# Patient Record
Sex: Female | Born: 1965 | Race: Black or African American | Hispanic: No | Marital: Married | Smoking: Never smoker
Health system: Southern US, Community
[De-identification: ages and names within clinical notes are randomized; demographics above are authoritative.]

## PROBLEM LIST (undated history)

## (undated) DIAGNOSIS — G35 Multiple sclerosis: Secondary | ICD-10-CM

## (undated) DIAGNOSIS — G35A Relapsing-remitting multiple sclerosis: Secondary | ICD-10-CM

## (undated) HISTORY — PX: TUBAL LIGATION: SHX77

## (undated) HISTORY — DX: Multiple sclerosis: G35

## (undated) HISTORY — PX: UTERINE FIBROID SURGERY: SHX826

## (undated) HISTORY — PX: CHOLECYSTECTOMY: SHX55

---

## 2016-09-30 ENCOUNTER — Other Ambulatory Visit: Payer: Self-pay | Admitting: Neurology

## 2016-09-30 ENCOUNTER — Other Ambulatory Visit: Payer: Self-pay | Admitting: Oncology

## 2016-09-30 ENCOUNTER — Other Ambulatory Visit (HOSPITAL_COMMUNITY): Payer: Self-pay | Admitting: Interventional Radiology

## 2016-09-30 DIAGNOSIS — Q232 Congenital mitral stenosis: Secondary | ICD-10-CM

## 2016-09-30 DIAGNOSIS — K220 Achalasia of cardia: Secondary | ICD-10-CM

## 2016-09-30 DIAGNOSIS — G35 Multiple sclerosis: Secondary | ICD-10-CM

## 2016-10-07 ENCOUNTER — Ambulatory Visit
Admission: RE | Admit: 2016-10-07 | Discharge: 2016-10-07 | Disposition: A | Discharge status: Home / Self Care | Payer: Managed Care, Other (non HMO) | Source: Ambulatory Visit | Attending: Neurology | Admitting: Neurology

## 2016-10-07 ENCOUNTER — Ambulatory Visit
Admission: RE | Admit: 2016-10-07 | Discharge: 2016-10-07 | Disposition: A | Discharge status: Home / Self Care | Payer: Self-pay | Source: Ambulatory Visit | Attending: Neurology | Admitting: Neurology

## 2016-10-07 ENCOUNTER — Ambulatory Visit: Payer: Self-pay

## 2016-10-07 ENCOUNTER — Other Ambulatory Visit: Payer: Self-pay | Admitting: Neurology

## 2016-10-07 DIAGNOSIS — G35 Multiple sclerosis: Secondary | ICD-10-CM

## 2016-10-07 LAB — CBC
HCT: 41.9 % (ref 35.0–47.0)
Hemoglobin: 13.3 g/dL (ref 12.0–16.0)
MCH: 26.5 pg (ref 26.0–34.0)
MCHC: 31.8 g/dL — AB (ref 32.0–36.0)
MCV: 83.3 fL (ref 80.0–100.0)
PLATELETS: 244 10*3/uL (ref 150–440)
RBC: 5.03 MIL/uL (ref 3.80–5.20)
RDW: 12.4 % (ref 11.5–14.5)
WBC: 4.3 10*3/uL (ref 3.6–11.0)

## 2016-10-07 LAB — APTT: aPTT: 31 seconds (ref 24–36)

## 2016-10-07 LAB — PROTIME-INR
INR: 0.98
Prothrombin Time: 13 seconds (ref 11.4–15.2)

## 2016-10-07 LAB — ALBUMIN: ALBUMIN: 4.5 g/dL (ref 3.5–5.0)

## 2016-10-07 NOTE — Progress Notes (Signed)
Patient is unable to have LP today-flying out of town-unable to recover 2 hrs. And follow D/C instructions at home.

## 2016-10-08 LAB — IGG 4: IgG, Subclass 4: 14 mg/dL (ref 2–96)

## 2016-10-09 ENCOUNTER — Ambulatory Visit: Payer: Self-pay

## 2016-10-13 ENCOUNTER — Ambulatory Visit: Payer: Self-pay

## 2016-10-13 ENCOUNTER — Ambulatory Visit
Admission: RE | Admit: 2016-10-13 | Discharge: 2016-10-13 | Disposition: A | Discharge status: Home / Self Care | Payer: Managed Care, Other (non HMO) | Source: Ambulatory Visit | Attending: Neurology | Admitting: Neurology

## 2016-10-13 DIAGNOSIS — G35 Multiple sclerosis: Secondary | ICD-10-CM | POA: Insufficient documentation

## 2016-10-13 LAB — CBC
HCT: 37.1 % (ref 35.0–47.0)
Hemoglobin: 12.3 g/dL (ref 12.0–16.0)
MCH: 27.1 pg (ref 26.0–34.0)
MCHC: 33.1 g/dL (ref 32.0–36.0)
MCV: 82 fL (ref 80.0–100.0)
PLATELETS: 193 10*3/uL (ref 150–440)
RBC: 4.53 MIL/uL (ref 3.80–5.20)
RDW: 12.7 % (ref 11.5–14.5)
WBC: 4.8 10*3/uL (ref 3.6–11.0)

## 2016-10-13 LAB — CSF CELL COUNT WITH DIFFERENTIAL
Eosinophils, CSF: 0 %
Lymphs, CSF: 96 %
Monocyte-Macrophage-Spinal Fluid: 4 %
RBC COUNT CSF: 43 /mm3 — AB (ref 0–3)
Segmented Neutrophils-CSF: 0 %
Tube #: 3
WBC CSF: 17 /mm3 — AB (ref 0–5)

## 2016-10-13 LAB — PROTEIN, CSF: TOTAL PROTEIN, CSF: 35 mg/dL (ref 15–45)

## 2016-10-13 LAB — PROTIME-INR
INR: 0.92
PROTHROMBIN TIME: 12.3 s (ref 11.4–15.2)

## 2016-10-13 LAB — GLUCOSE, CSF: Glucose, CSF: 57 mg/dL (ref 40–70)

## 2016-10-13 LAB — ALBUMIN: Albumin: 4.1 g/dL (ref 3.5–5.0)

## 2016-10-13 MED ORDER — ACETAMINOPHEN 500 MG PO TABS
1000.0000 mg | ORAL_TABLET | Freq: Four times a day (QID) | ORAL | Status: DC | PRN
  Filled 2016-10-13: qty 2

## 2016-10-14 LAB — CSF IGG: IGG CSF: 9 mg/dL — AB (ref 0.0–8.6)

## 2016-10-15 ENCOUNTER — Ambulatory Visit: Payer: Managed Care, Other (non HMO)

## 2016-10-16 LAB — CSF CULTURE
CULTURE: NO GROWTH
GRAM STAIN: NONE SEEN

## 2016-10-16 LAB — CSF CULTURE W GRAM STAIN

## 2016-10-17 LAB — HSV(HERPES SMPLX VRS)ABS-I+II(IGG)-CSF

## 2016-10-20 ENCOUNTER — Ambulatory Visit
Admission: RE | Admit: 2016-10-20 | Discharge: 2016-10-20 | Disposition: A | Discharge status: Home / Self Care | Payer: 59 | Source: Ambulatory Visit | Attending: Neurology | Admitting: Neurology

## 2016-10-20 DIAGNOSIS — M50222 Other cervical disc displacement at C5-C6 level: Secondary | ICD-10-CM | POA: Insufficient documentation

## 2016-10-20 DIAGNOSIS — G35 Multiple sclerosis: Secondary | ICD-10-CM | POA: Diagnosis present

## 2016-10-20 MED ORDER — GADOBENATE DIMEGLUMINE 529 MG/ML IV SOLN
12.0000 mL | Freq: Once | INTRAVENOUS | Status: AC | PRN
  Administered 2016-10-20: 12 mL via INTRAVENOUS

## 2016-10-25 LAB — OLIGOCLONAL BANDS, CSF + SERM

## 2018-03-08 ENCOUNTER — Other Ambulatory Visit (HOSPITAL_COMMUNITY): Payer: Self-pay | Admitting: Physician Assistant

## 2018-03-08 DIAGNOSIS — G35 Multiple sclerosis: Secondary | ICD-10-CM

## 2018-03-21 ENCOUNTER — Ambulatory Visit: Payer: 59

## 2018-04-04 ENCOUNTER — Encounter: Payer: Self-pay | Admitting: Emergency Medicine

## 2018-04-04 ENCOUNTER — Other Ambulatory Visit: Payer: Self-pay

## 2018-04-04 ENCOUNTER — Emergency Department: Payer: 59

## 2018-04-04 ENCOUNTER — Emergency Department
Admission: EM | Admit: 2018-04-04 | Discharge: 2018-04-04 | Disposition: A | Discharge status: Home / Self Care | Payer: 59 | Attending: Emergency Medicine | Admitting: Emergency Medicine

## 2018-04-04 ENCOUNTER — Ambulatory Visit
Admission: RE | Admit: 2018-04-04 | Discharge: 2018-04-04 | Disposition: A | Discharge status: Home / Self Care | Payer: 59 | Source: Ambulatory Visit | Attending: Physician Assistant | Admitting: Physician Assistant

## 2018-04-04 DIAGNOSIS — G35 Multiple sclerosis: Secondary | ICD-10-CM | POA: Insufficient documentation

## 2018-04-04 DIAGNOSIS — Z79899 Other long term (current) drug therapy: Secondary | ICD-10-CM | POA: Diagnosis not present

## 2018-04-04 DIAGNOSIS — M79604 Pain in right leg: Secondary | ICD-10-CM | POA: Diagnosis not present

## 2018-04-04 MED ORDER — GADOBENATE DIMEGLUMINE 529 MG/ML IV SOLN
15.0000 mL | Freq: Once | INTRAVENOUS | Status: AC | PRN
  Administered 2018-04-04: 12 mL via INTRAVENOUS

## 2018-04-04 NOTE — Discharge Instructions (Addendum)
Follow up with your primary care provider if any continued problems.  You may take Tylenol as needed for leg pain.

## 2018-04-04 NOTE — ED Triage Notes (Signed)
Pt states right leg tenderness noted yesterday, concerned for blood clot, area noted to be bruised, no swelling or warmth noted.

## 2018-04-04 NOTE — ED Provider Notes (Signed)
[facility] Emergency Department Provider Note   ____________________________________________   First MD Initiated Contact with Patient 04/04/18 [phone]     (approximate)  I have reviewed the triage vital signs and the nursing notes.   HISTORY  Chief Complaint Leg Pain   HPI Tabitha Andrews is a 52 y.o. female is here with complaint of right lower leg bruising with some mild pain.  Patient states that she is unaware of any injury to her leg but has a bruise on the posterior aspect of her lower leg.  Patient states that she does sleep with her legs crossed at night.  Patient is concerned of a DVT.  She states that she flew last month for 2 hours.  She denies any difficulty breathing.  She continues to ambulate without any difficulty.  She is not aware of any warmth or redness in the area.  Patient does have a history of multiple sclerosis.  She rates her pain as a 1/10.  History reviewed. No pertinent past medical history.  There are no active problems to display for this patient.   Past Surgical History:  Procedure Laterality Date  . CHOLECYSTECTOMY    . UTERINE FIBROID SURGERY N/A     Prior to Admission medications   Medication Sig Start Date End Date Taking? Authorizing Provider  Cholecalciferol 10000 units TABS Take by mouth.   Yes [provider]  hydroquinone 4 % cream Apply topically 2 (two) times daily.   Yes [provider]    Allergies Patient has no known allergies.  No family history on file.  Social History Social History   Tobacco Use  . Smoking status: Never Smoker  . Smokeless tobacco: Never Used  Substance Use Topics  . Alcohol use: No  . Drug use: No    Review of Systems Constitutional: No fever/chills Cardiovascular: Denies chest pain. Respiratory: Denies shortness of breath. Musculoskeletal: Positive for right leg pain and localized bruise.. Skin: Negative for rash. Neurological: Negative for focal  weakness or numbness. ___________________________________________   PHYSICAL EXAM:  VITAL SIGNS: ED Triage Vitals  Enc Vitals Group     BP 04/04/18 0849 110/71     Pulse Rate 04/04/18 0849 70     Resp 04/04/18 0849 18     Temp 04/04/18 0849 98.5 F (36.9 C)     Temp src --      SpO2 04/04/18 0849 99 %     Weight 04/04/18 0850 134 lb (60.8 kg)     Height 04/04/18 0850 5\' 4"  (1.626 m)     Head Circumference --      Peak Flow --      Pain Score 04/04/18 0850 1     Pain Loc --      Pain Edu? --      Excl. in GC? --    Constitutional: Alert and oriented. Well appearing and in no acute distress. Eyes: Conjunctivae are normal.  Head: Atraumatic. Neck: No stridor.   Cardiovascular: Normal rate, regular rhythm. Grossly normal heart sounds.  Good peripheral circulation. Respiratory: Normal respiratory effort.  No retractions. Lungs CTAB. Gastrointestinal: Soft and nontender. No distention.  Musculoskeletal: Examination of the right leg there is no gross deformity and no edema appreciated in comparison to the left.  Range of motion is without restriction.  There is a localized single ecchymotic area posterior calf approximately 4 to 5 cm below the knee joint.  Skin is still intact.  No warmth or redness is appreciated.  Homans sign is negative. Neurologic:  Normal speech and language. No gross focal neurologic deficits are appreciated. No gait instability. Skin:  Skin is warm, dry and intact.  Ecchymotic area as noted above. Psychiatric: Mood and affect are normal. Speech and behavior are normal.  ____________________________________________   LABS (all labs ordered are listed, but only abnormal results are displayed)  Labs Reviewed - No data to display   RADIOLOGY  Official radiology report(s): US Venous Img Lower Unilateral Right  Result Date: 04/04/2018 CLINICAL DATA:  Pain and bruising, recent air travel EXAM: RIGHT LOWER EXTREMITY VENOUS DOPPLER ULTRASOUND TECHNIQUE:  Gray-scale sonography with compression, as well as color and duplex ultrasound, were performed to evaluate the deep venous system from the level of the common femoral vein through the popliteal and proximal calf veins. COMPARISON:  None FINDINGS: Normal compressibility of the common femoral, superficial femoral, and popliteal veins, as well as the proximal calf veins. No filling defects to suggest DVT on grayscale or color Doppler imaging. Doppler waveforms show normal direction of venous flow, normal respiratory phasicity and response to augmentation. Survey views of the contralateral common femoral vein are unremarkable. IMPRESSION: No evidence of right lower extremity deep vein thrombosis. Electronically Signed   By: Corlis Leak M.D.   On: 04/04/2018 10:10    ____________________________________________   PROCEDURES  Procedure(s) performed: None  Procedures  Critical Care performed: No  ____________________________________________   INITIAL IMPRESSION / ASSESSMENT AND PLAN / ED COURSE  As part of my medical decision making, I reviewed the following data within the electronic MEDICAL RECORD NUMBER Notes from prior ED visits and [place] Controlled Substance Database  Patient is here with complaint of right lower leg pain and tenderness x1 day.  Patient is concerned for a DVT without previous history.  Patient flew last month but has no other risk factors.  Ultrasound was negative for the DVT and patient was reassured.  She is to follow-up with her PCP if any continued problems.  She will take Tylenol if needed for pain.  ____________________________________________   FINAL CLINICAL IMPRESSION(S) / ED DIAGNOSES  Final diagnoses:  Right leg pain     ED Discharge Orders    None       Note:  This document was prepared using Dragon voice recognition software and may include unintentional dictation errors.    Tommi Rumps, PA-C 04/04/18 1104    Arnaldo Natal, MD 04/04/18  1537

## 2018-04-04 NOTE — ED Notes (Signed)
See triage note  Presents with right leg tenderness  States sx's started yesterday  Denies any injury  Small bruising noted

## 2019-07-31 ENCOUNTER — Other Ambulatory Visit
Admission: RE | Admit: 2019-07-31 | Discharge: 2019-07-31 | Disposition: A | Discharge status: Home / Self Care | Payer: Self-pay | Source: Ambulatory Visit | Attending: Neurology | Admitting: Neurology

## 2019-07-31 DIAGNOSIS — G35 Multiple sclerosis: Secondary | ICD-10-CM | POA: Insufficient documentation

## 2019-07-31 DIAGNOSIS — Z79899 Other long term (current) drug therapy: Secondary | ICD-10-CM | POA: Insufficient documentation

## 2019-07-31 LAB — COMPREHENSIVE METABOLIC PANEL
ALT: 19 U/L (ref 0–44)
AST: 19 U/L (ref 15–41)
Albumin: 4.5 g/dL (ref 3.5–5.0)
Alkaline Phosphatase: 83 U/L (ref 38–126)
Anion gap: 9 (ref 5–15)
BUN: 18 mg/dL (ref 6–20)
CO2: 30 mmol/L (ref 22–32)
Calcium: 9.4 mg/dL (ref 8.9–10.3)
Chloride: 99 mmol/L (ref 98–111)
Creatinine, Ser: 0.78 mg/dL (ref 0.44–1.00)
GFR calc Af Amer: >60 mL/min (ref >60)
GFR calc non Af Amer: >60 mL/min (ref >60)
Glucose, Bld: 90 mg/dL (ref 70–99)
Potassium: 3.4 mmol/L — ABNORMAL LOW (ref 3.5–5.1)
Sodium: 138 mmol/L (ref 135–145)
Total Bilirubin: 0.6 mg/dL (ref 0.3–1.2)
Total Protein: 8.2 g/dL — ABNORMAL HIGH (ref 6.5–8.1)

## 2019-07-31 LAB — CBC WITH DIFFERENTIAL/PLATELET
Abs Immature Granulocytes: 0.01 10*3/uL (ref 0.00–0.07)
Basophils Absolute: 0 10*3/uL (ref 0.0–0.1)
Basophils Relative: 1 %
Eosinophils Absolute: 0 10*3/uL (ref 0.0–0.5)
Eosinophils Relative: 0 %
HCT: 42.3 % (ref 36.0–46.0)
Hemoglobin: 13.3 g/dL (ref 12.0–15.0)
Immature Granulocytes: 0 %
Lymphocytes Relative: 32 %
Lymphs Abs: 1 10*3/uL (ref 0.7–4.0)
MCH: 26.9 pg (ref 26.0–34.0)
MCHC: 31.4 g/dL (ref 30.0–36.0)
MCV: 85.5 fL (ref 80.0–100.0)
Monocytes Absolute: 0.4 10*3/uL (ref 0.1–1.0)
Monocytes Relative: 14 %
Neutro Abs: 1.7 10*3/uL (ref 1.7–7.7)
Neutrophils Relative %: 53 %
Platelets: 218 10*3/uL (ref 150–400)
RBC: 4.95 MIL/uL (ref 3.87–5.11)
RDW: 12.4 % (ref 11.5–15.5)
WBC: 3.2 10*3/uL — ABNORMAL LOW (ref 4.0–10.5)
nRBC: 0 % (ref 0.0–0.2)

## 2019-07-31 LAB — HEPATITIS C ANTIBODY: HCV Ab: NONREACTIVE

## 2019-07-31 LAB — HEPATITIS B CORE ANTIBODY, TOTAL: Hep B Core Total Ab: NONREACTIVE

## 2019-07-31 LAB — PREGNANCY, URINE: Preg Test, Ur: NEGATIVE

## 2019-07-31 LAB — HEPATITIS B SURFACE ANTIGEN: Hepatitis B Surface Ag: NONREACTIVE

## 2019-07-31 LAB — HIV ANTIBODY (ROUTINE TESTING W REFLEX): HIV Screen 4th Generation wRfx: NONREACTIVE

## 2019-08-01 LAB — HEPATITIS B SURFACE ANTIBODY, QUANTITATIVE: Hepatitis B-Post: <3.1 m[IU]/mL — ABNORMAL LOW (ref >9.9)

## 2019-08-02 LAB — QUANTIFERON-TB GOLD PLUS (RQFGPL)
QuantiFERON Mitogen Value: 6.41 IU/mL
QuantiFERON Nil Value: 0.03 IU/mL
QuantiFERON TB1 Ag Value: 0.04 IU/mL
QuantiFERON TB2 Ag Value: 0.03 IU/mL

## 2019-08-02 LAB — QUANTIFERON-TB GOLD PLUS: QuantiFERON-TB Gold Plus: NEGATIVE

## 2020-03-02 IMAGING — US US EXTREM LOW VENOUS*R*
1 series · 14 of 24 positions shown · non-contrast
Comparison: None

CLINICAL DATA: Pain and bruising, recent air travel

EXAM:
RIGHT LOWER EXTREMITY VENOUS DOPPLER ULTRASOUND
TECHNIQUE: Gray-scale sonography with compression, as well as color and duplex
ultrasound, were performed to evaluate the deep venous system from
the level of the common femoral vein through the popliteal and
proximal calf veins.

[Series 1: us extrem low venous*right* · 0.06mm/px · 14 of 41 slices shown]
[im 1/41]
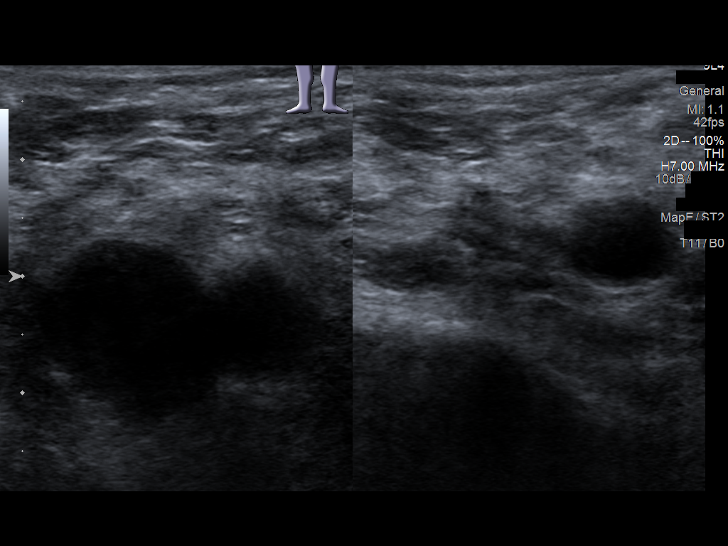
[im 4/41]
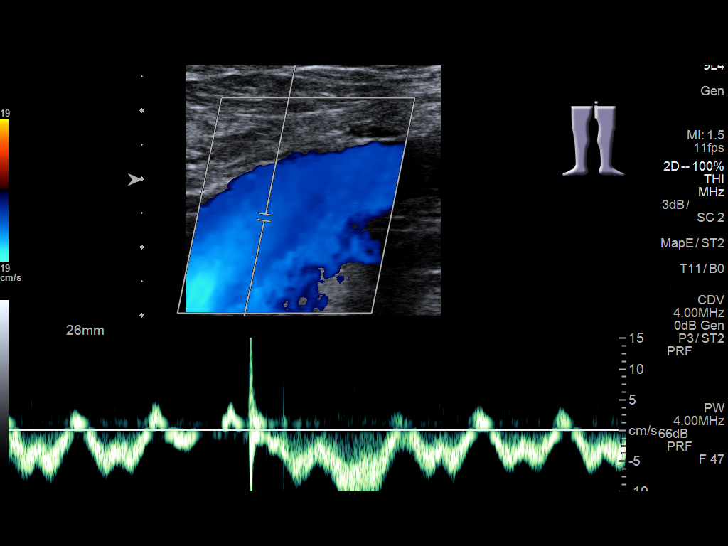
[im 7/41]
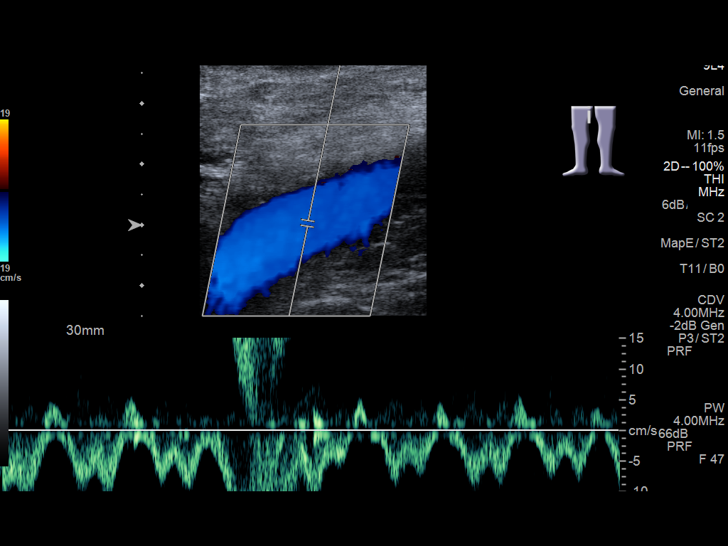
[im 11/41]
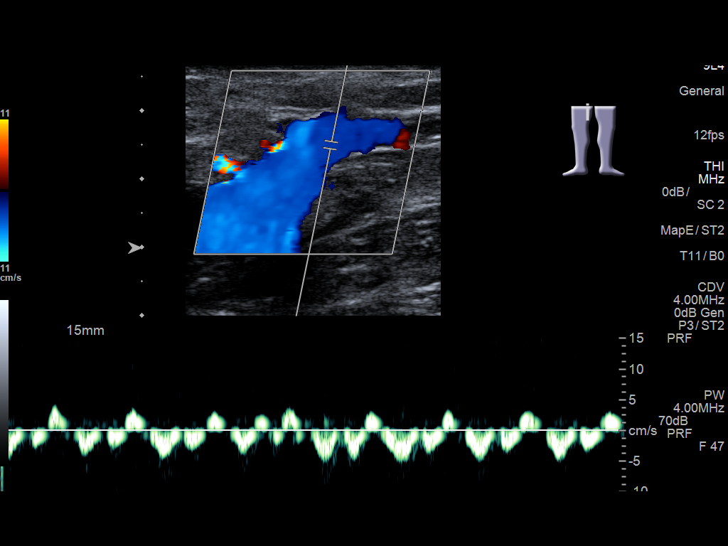
[im 13/41]
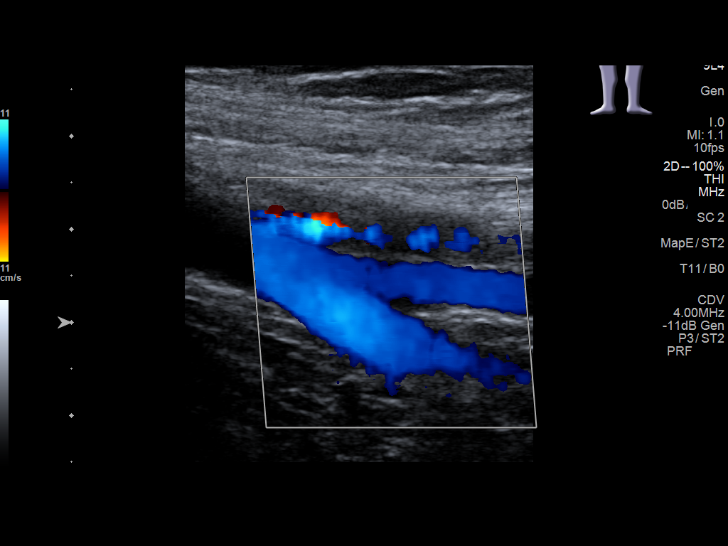
[im 16/41]
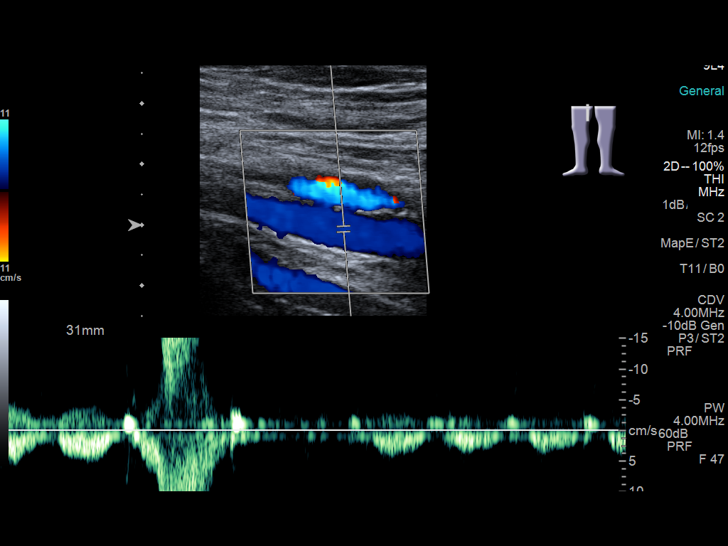
[im 20/41]
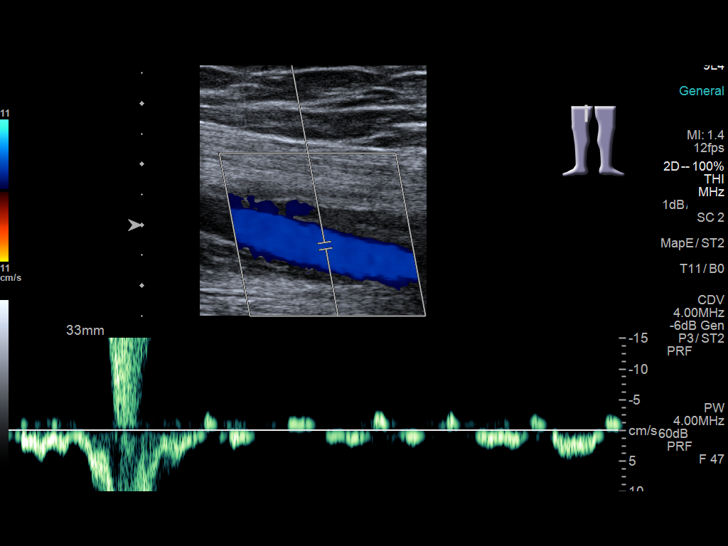
[im 21/41]
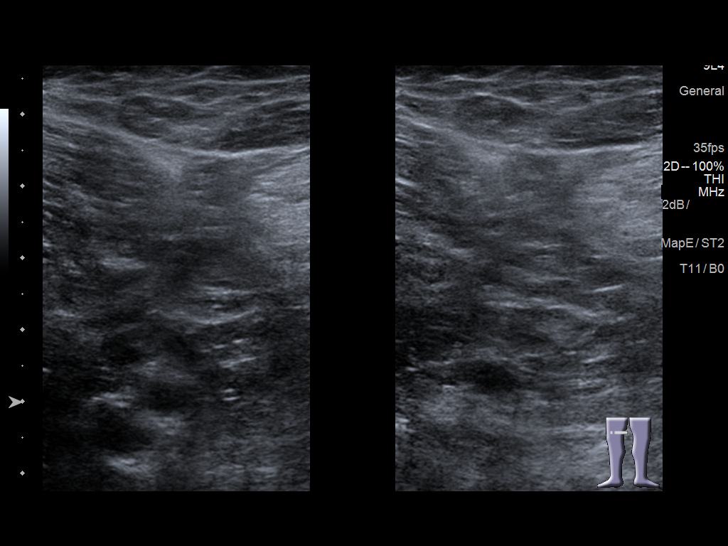
[im 25/41]
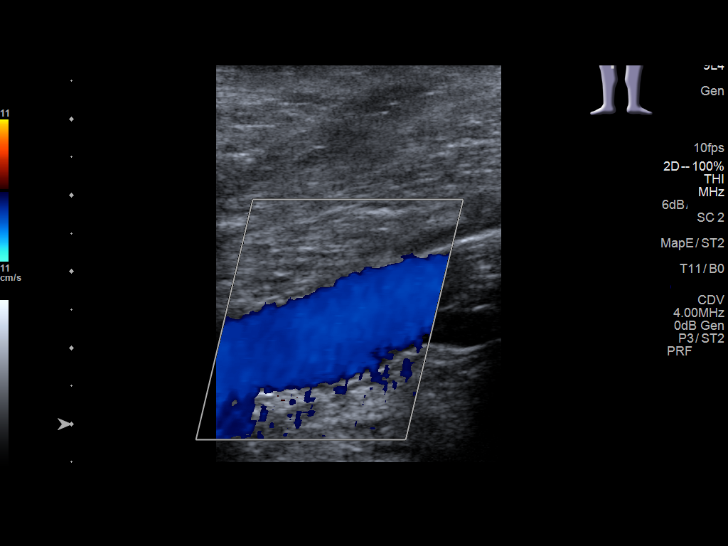
[im 28/41]
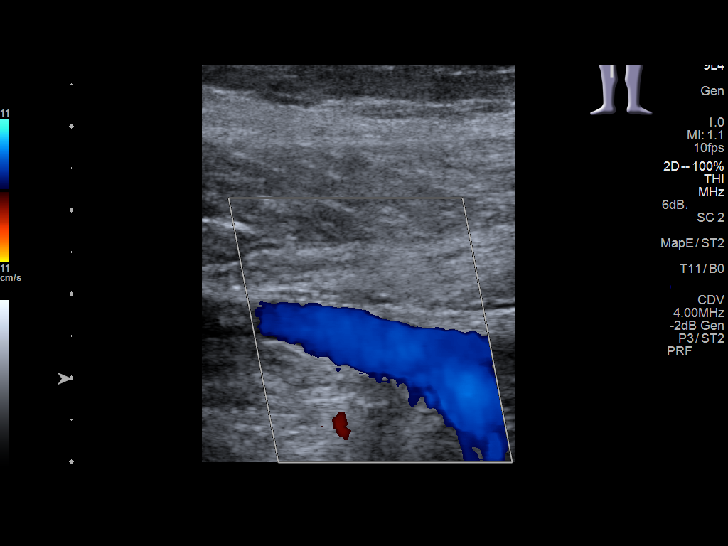
[im 32/41]
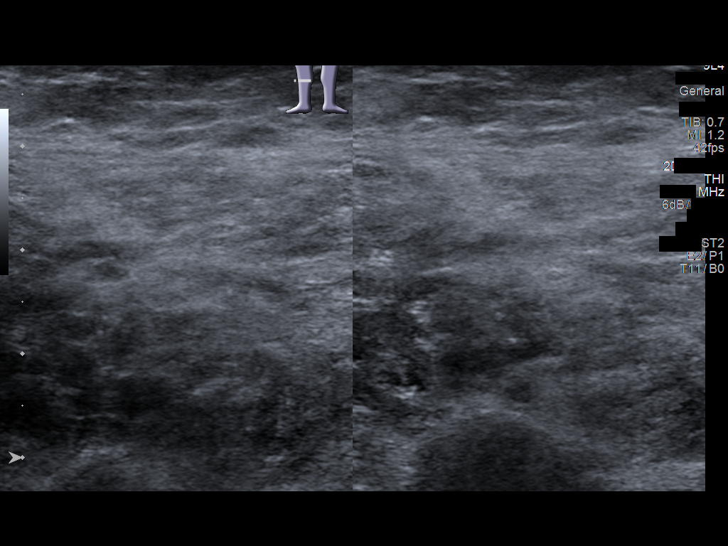
[im 34/41]
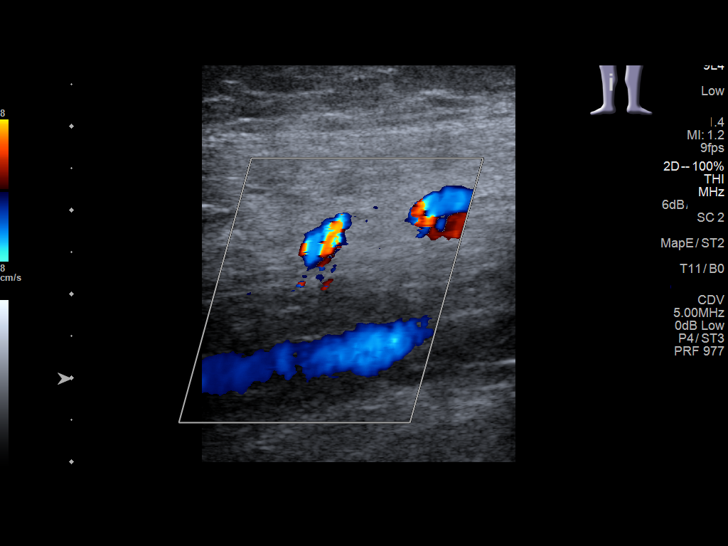
[im 37/41]
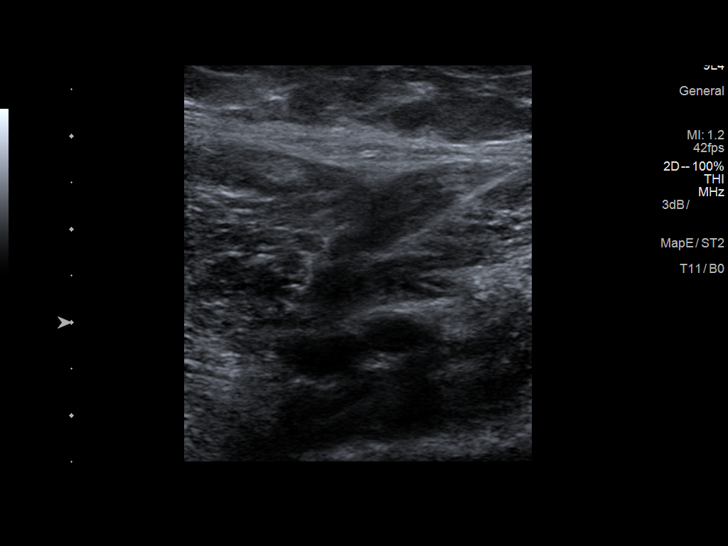
[im 41/41]
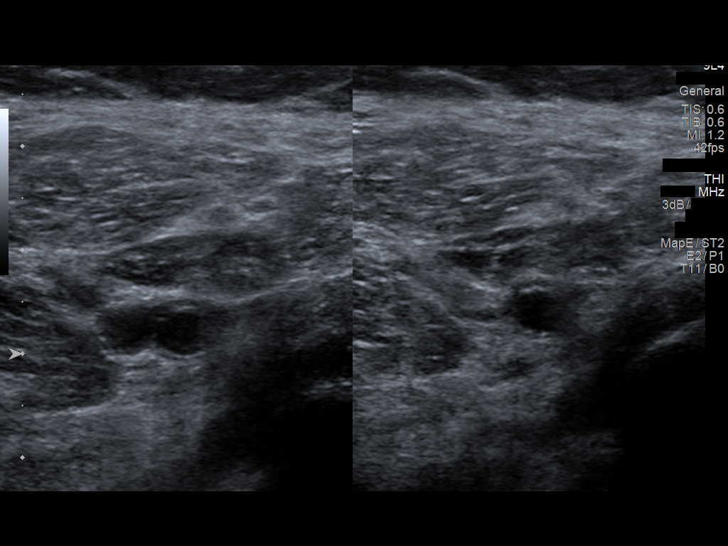

[14 of 24 positions shown; findings below may reference images not displayed]

FINDINGS: Normal compressibility of the common femoral, superficial femoral,
and popliteal veins, as well as the proximal calf veins. No filling
defects to suggest DVT on grayscale or color Doppler imaging.
Doppler waveforms show normal direction of venous flow, normal
respiratory phasicity and response to augmentation. Survey views of
the contralateral common femoral vein are unremarkable.
IMPRESSION: No evidence of right lower extremity deep vein thrombosis.

## 2020-07-25 ENCOUNTER — Ambulatory Visit: Payer: BLUE CROSS/BLUE SHIELD | Admitting: Occupational Therapy

## 2020-07-25 ENCOUNTER — Other Ambulatory Visit: Payer: Self-pay

## 2020-07-25 ENCOUNTER — Encounter: Payer: Self-pay | Admitting: Occupational Therapy

## 2020-07-25 ENCOUNTER — Ambulatory Visit: Payer: BLUE CROSS/BLUE SHIELD | Attending: Neurology | Admitting: Physical Therapy

## 2020-07-25 DIAGNOSIS — M6281 Muscle weakness (generalized): Secondary | ICD-10-CM | POA: Diagnosis present

## 2020-07-25 DIAGNOSIS — R2681 Unsteadiness on feet: Secondary | ICD-10-CM | POA: Diagnosis present

## 2020-07-25 DIAGNOSIS — R2689 Other abnormalities of gait and mobility: Secondary | ICD-10-CM | POA: Insufficient documentation

## 2020-07-25 NOTE — Therapy (Signed)
[facility] [address] [place], [place], [address] Phone: [phone]   Fax:  [phone]  Physical Therapy Evaluation  Patient Details  Name: Tabitha Andrews MRN: [id number] Date of Birth: [birth year] Referring Provider (PT): Allena Earing   Encounter Date: 07/25/2020   PT End of Session - 07/25/20 1256    Visit Number 1    Number of Visits 13    Authorization Type [employer] (per pt report)    PT Start Time 0932    PT Stop Time 1019    PT Time Calculation (min) 47 min    Equipment Utilized During Treatment Gait belt    Activity Tolerance Patient tolerated treatment well    Behavior During Therapy [facility] for tasks assessed/performed           No past medical history on file.  Past Surgical History:  Procedure Laterality Date  . CHOLECYSTECTOMY    . UTERINE FIBROID SURGERY N/A     There were no vitals filed for this visit.    Subjective Assessment - 07/25/20 0939    Subjective Started feeling the effects of my MS around 2017.  Walked maybe a little slower, but didn't notice as much.  Feel that it has progressed in the past 4 years.  Started with difficulty moving legs and had a fall in 2018.  RLE drags more/doesn't move as quickly, and that causes me to fall.  Have had one fall in the past 6 months.    Patient Stated Goals I want to be able to walk as close as normal as possible.    Currently in Pain? No/denies   occasional pain in back             [facility] PT Assessment - 07/25/20 0945      Assessment   Medical Diagnosis MS    Referring Provider (PT) Allena Earing    Onset Date/Surgical Date 07/14/20   referral date; 2017 MS dx   Hand Dominance Right      Precautions   Precautions Fall      Balance Screen   Has the patient fallen in the past 6 months Yes    How many times? 1    Has the patient had a decrease in activity level because of a fear of falling?  Yes   also due to inability to do  things   Is the patient reluctant to leave their home because of a fear of falling?  No      Home Environment   Living Environment Private residence    Living Arrangements Spouse/significant other;Children   12 yo daughter   Available Help at Discharge Family    Type of Home House    Home Access Stairs to enter    Entrance Stairs-Number of Steps 3    Entrance Stairs-Rails None   holds to car   Home Layout Two level;Bed/bath upstairs   Basement level also   Alternate Level Stairs-Number of Steps 12    Alternate Level Stairs-Rails Right;Left;Can reach both    Home Equipment Cane - single point      Prior Function   Level of Independence Independent with basic ADLs;Independent with homemaking with ambulation    Vocation Full time employment    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   Leisure Enjoys activities with family (tries to avoid activities with excess walking)      Observation/Other Assessments   Focus on Therapeutic Outcomes (FOTO)  NA  Sensation   Light Touch Appears Intact      Posture/Postural Control   Posture/Postural Control Postural limitations    Posture Comments Lateral trunk lean with seated hip flexion      ROM / Strength   AROM / PROM / Strength Strength      Strength   Overall Strength Deficits    Strength Assessment Site Hip;Knee;Ankle    Right/Left Hip Right;Left    Right Hip Flexion 3/5    Left Hip Flexion 3+/5    Right/Left Knee Right;Left    Right Knee Flexion 3+/5    Right Knee Extension 4/5    Left Knee Flexion 4/5    Left Knee Extension 4/5    Right/Left Ankle Right;Left    Right Ankle Dorsiflexion 3+/5    Left Ankle Dorsiflexion 4/5      Transfers   Transfers Sit to Stand;Stand to Sit    Sit to Stand 5: Supervision;Without upper extremity assist;From chair/3-in-1    Sit to Stand Details (indicate cue type and reason) Pt reports she would not be able to complete 5 additional reps of sit<>stand, if asked, due to  fatigue    Five time sit to stand comments  11.5    Stand to Sit 5: Supervision      Ambulation/Gait   Ambulation/Gait Yes    Ambulation/Gait Assistance 5: Supervision    Ambulation Distance (Feet) 100 Feet   x 2   Assistive device None    Gait Pattern Step-through pattern;Decreased arm swing - right;Decreased step length - right;Decreased stance time - right;Decreased hip/knee flexion - right;Decreased dorsiflexion - right;Decreased weight shift to right;Right genu recurvatum;Decreased trunk rotation;Narrow base of support    Ambulation Surface Level;Indoor    Gait velocity 14.13 sec = 2.32 ft/sec      Balance   Balance Assessed Yes    Balance comment RLE SLS:  unable first trial, then 3 sec; LLE SLS 2.59 sec, then 6.75 sec; tandem stance:  7.5 sec LLE posterior; 9.7 sec RLE posterior.    Pt able to stand EO and EC  feet apart on solid surface x 30 seconds on on foam x 30 seconds (with incr. sway noted EC on foam.)      Static Standing Balance   Static Standing - Balance Support No upper extremity supported    Static Standing - Level of Assistance 5: Stand by assistance   Min guard at times   Static Standing Balance -  Activities  Single Leg Stance - Right Leg;Single Leg Stance - Left Leg;Tandam Stance - Right Leg;Tandam Stance - Left Leg      Standardized Balance Assessment   Standardized Balance Assessment Timed Up and Go Test      Timed Up and Go Test   TUG Normal TUG;Manual TUG    Normal TUG (seconds) 14.07    Manual TUG (seconds) 14.88    TUG Comments Scores >13.5-14.5 sec indicates increased fall risk.                      Objective measurements completed on examination: See above findings.               PT Education - 07/25/20 1255    Education Details PT eval results and POC    Person(s) Educated Patient    Methods Explanation    Comprehension Verbalized understanding            PT Short Term Goals - 07/25/20 1305  PT SHORT TERM GOAL  #1   Title Pt will be independent with HEP for improved strength, balance, transfers, and gait.  TARGET 08/29/2020 (due to pt being out of town over the holiday)    Time 4    Period Weeks    Status New      PT SHORT TERM GOAL #2   Title Pt will improve TUG score to less than or equal to 13 seconds for improved balance/decreased fall risk.    Baseline 14.07    Time 4    Period Weeks    Status New      PT SHORT TERM GOAL #3   Title Sensory Organization test to be assessed, with goal to be written as appropriate.    Time 4    Period Weeks    Status New      PT SHORT TERM GOAL #4   Title Pt will improve SLS each leg to 3 seconds for improved balance for dressing, stair negoitation.    Baseline R unable first try, L 2.59 sec first try    Time 4    Period Weeks    Status New      PT SHORT TERM GOAL #5   Title Pt will improve tandem stance to at least 30 seconds each foot position to demonstrate improved hip stability for balance.    Baseline 7.5 sec, 9.7 sec    Time 4    Period Weeks    Status New      Additional Short Term Goals   Additional Short Term Goals Yes      PT SHORT TERM GOAL #6   Title Pt will verbalize understanding of fall prevention in home environment.    Time 4    Period Weeks    Status New             PT Long Term Goals - 07/25/20 1309      PT LONG TERM GOAL #1   Title Pt will be independent with progression of HEP for improved strength, balance, transfers, and gait.  TARGET 09/12/2020    Time 6    Period Weeks    Status New      PT LONG TERM GOAL #2   Title Pt will improve gait velocity to at least 2.6 ft/sec for improved gait efficiency and safety.    Time 6    Period Weeks    Status New      PT LONG TERM GOAL #3   Title Pt will improve 5x sit<>stand to less than or equal to 9 seconds for improved functional strength and transfer efficiency.    Time 6    Period Weeks    Status New      PT LONG TERM GOAL #4   Title Pt will ambulate at  least 500 ft modified independently with assistive device as appropriate and/or orthotic for improved safety and stability with gait.    Time 6    Period Weeks    Status New                  Plan - 07/25/20 1257    Clinical Impression Statement Pt is a 54 year old female who presents to OPPT with history of MS since 2017.  She notes R sided weakness, stiffness and hx of 1 fall in past 6 months.  She presents to OPPT with decreased RLE (and LLE) strength, decreased trunk strength, abnormal tone RLE, decreased timing and coordination of  gait, decreased balance.  She is at fall risk per TUG score and is a limited community ambulator per gait velocity score.  Based on pt's movement patterns and her reports during eval, it appears muscle fatigue and decreased endurance play a role in limitation of her functional mobility as well.  Pt will benefit from skilled PT to address the above stated deficits for decreased fall risk and improved overall functional mobility.    Examination-Activity Limitations Locomotion Level;Transfers;Stairs;Stand    Examination-Participation Restrictions Community Activity    Stability/Clinical Decision Making Stable/Uncomplicated    Clinical Decision Making Low    Rehab Potential Good    PT Frequency 2x / week    PT Duration 6 weeks   plus eval   PT Treatment/Interventions ADLs/Self Care Home Management;Electrical Stimulation;DME Instruction;Neuromuscular re-education;Balance training;Therapeutic exercise;Therapeutic activities;Functional mobility training;Stair training;Gait training;Patient/family education;Manual techniques;Energy conservation    PT Next Visit Plan Check hip abduction and hip extension strength; initiate hip strengthening; Initiate HEP to address SLS, tandem stance; need to do Sensory ORganization test if able (write goal); work on vestibular balance exercises   At some point-may try foot-up/AFO/Bioness for gait training   Consulted and Agree with  Plan of Care Patient           Patient will benefit from skilled therapeutic intervention in order to improve the following deficits and impairments:  Abnormal gait, Difficulty walking, Impaired tone, Decreased endurance, Decreased balance, Decreased mobility, Decreased strength  Visit Diagnosis: Other abnormalities of gait and mobility  Unsteadiness on feet  Muscle weakness (generalized)     Problem List There are no problems to display for this patient.   Prudie Guthridge W. 07/25/2020, 1:13 PM  Lonia Blood, PT 07/25/20 1:15 PM Phone: [phone] Fax: [phone]     [facility] [address] [place], [place], [address] Phone: [phone]   Fax:  [phone]  Name: Karime Scheuermann MRN: [id number] Date of Birth: 10-Mar-1966

## 2020-07-25 NOTE — Patient Instructions (Signed)
1. Grip Strengthening (Resistive Putty)   Squeeze putty using thumb and all fingers. Repeat _20___ times. Do __2__ sessions per day.   2. Roll putty into tube on table and pinch between each finger and thumb x 10 reps each. (can do ring and small finger together)     Copyright  VHI. All rights reserved.   

## 2020-07-25 NOTE — Therapy (Signed)
[facility] [address] [place], [place], [address] Phone: [phone]   Fax:  [phone]  Occupational Therapy Evaluation  Patient Details  Name: Tabitha Andrews MRN: [id number] Date of Birth: July 24, 1966 Referring Provider (OT): Dr. Gaynell Face   Encounter Date: 07/25/2020   OT End of Session - 07/25/20 1654    Visit Number 1    Number of Visits 1    Authorization Type BCBS    OT Start Time 0850    OT Stop Time 0930    OT Time Calculation (min) 40 min    Behavior During Therapy [facility] for tasks assessed/performed           History reviewed. No pertinent past medical history.  Past Surgical History:  Procedure Laterality Date  . CHOLECYSTECTOMY    . UTERINE FIBROID SURGERY N/A     There were no vitals filed for this visit.   Subjective Assessment - 07/25/20 0857    Subjective  Pt reports she has not had therapy in a few years    Pertinent History MS    Patient Stated Goals improve handwriting, typing is slower    Currently in Pain? Yes    Pain Score 1     Pain Location Back    Pain Descriptors / Indicators Aching    Pain Type Chronic pain    Pain Onset More than a month ago    Pain Frequency Intermittent    Aggravating Factors  laying on back    Pain Relieving Factors repositioning    Multiple Pain Sites No             [facility] OT Assessment - 07/25/20 1658      Assessment   Medical Diagnosis MS    Referring Provider (OT) Dr. Gaynell Face    Onset Date/Surgical Date 07/14/20    Hand Dominance Right      Precautions   Precautions Fall      Balance Screen   Has the patient fallen in the past 6 months Yes    How many times? 1    Has the patient had a decrease in activity level because of a fear of falling?  No    Is the patient reluctant to leave their home because of a fear of falling?  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     ADL   Eating/Feeding Independent    Grooming Independent    Lower Body Bathing Modified independent    Upper Body Dressing Independent   has been using tub, but has walkin shower and seat   Lower Body Dressing Modified independent    Tub/Shower Transfer Modified independent   min A at times due to hx of falls     Written Expression   Handwriting 100% legible   mild cahnges to legibility     Vision Assessment   Vision Assessment Vision not tested    Comment wears glasses for driving at night, blurriness       Activity Tolerance   Activity Tolerance Endurance does not limit participation in activity      Cognition   Overall Cognitive Status Within Functional  Limits for tasks assessed      Sensation   Light Touch Appears Intact      Coordination   Gross Motor Movements are Fluid and Coordinated Yes    9 Hole Peg Test Right;Left    Right 9 Hole Peg Test 29.66    Left 9 Hole Peg Test 39.69 secs      ROM / Strength   AROM / PROM / Strength AROM;Strength      AROM   Overall AROM  Within functional limits for tasks performed      Strength   Overall Strength Comments UE grossly WFLS 4+/5-5/5      Hand Function   Right Hand Grip (lbs) 42.7 lbs    Left Hand Grip (lbs) 53.5 lbs                           OT Education - 07/25/20 1653    Education Details putty HEP, Eval results    Person(s) Educated Patient    Methods Explanation;Demonstration;Verbal cues;Handout    Comprehension Verbalized understanding;Returned demonstration;Verbal cues required               OT Long Term Goals - 07/25/20 1658      OT LONG TERM GOAL #1   Title n/a, 1x eval                 Plan - 07/25/20 1654    Clinical  Impression Statement Pt is a 54 y.o feamle with a hx of MS. Pt present with mild strength deficits. Pt does not wish to pursue OT at this time, she will work with PT to address balance. Red putty HEP issued.    OT Occupational Profile and History Problem Focused Assessment - Including review of records relating to presenting problem    Occupational performance deficits (Please refer to evaluation for details): ADL's;IADL's;Work;Leisure;Social Participation    Body Structure / Function / Physical Skills Dexterity;Strength    Rehab Potential Good    Clinical Decision Making Limited treatment options, no task modification necessary    Comorbidities Affecting Occupational Performance: None    Modification or Assistance to Complete Evaluation  No modification of tasks or assist necessary to complete eval    OT Frequency One time visit    OT Duration 4 weeks    OT Treatment/Interventions Self-care/ADL training;Patient/family education;Therapeutic exercise    Plan Pt demonstrates understanding of HEP, she agrees with 1x eval.    Consulted and Agree with Plan of Care Patient           Patient will benefit from skilled therapeutic intervention in order to improve the following deficits and impairments:   Body Structure / Function / Physical Skills: Dexterity, Strength       Visit Diagnosis: Muscle weakness (generalized)    Problem List There are no problems to display for this patient.   Tabitha Andrews 07/25/2020, 5:01 PM Tabitha Andrews, Tabitha Andrews [phone] Phone: [phone] 5:01 PM 07/25/20 [facility] [address] [place], [place], [address] Phone: [phone]   Fax:  [phone]  Name: Tabitha Andrews MRN: [id number] Date of Birth: Jun 29, 1966

## 2020-09-03 ENCOUNTER — Ambulatory Visit: Payer: BLUE CROSS/BLUE SHIELD | Admitting: Physical Therapy

## 2020-09-05 ENCOUNTER — Ambulatory Visit: Payer: BLUE CROSS/BLUE SHIELD | Attending: Neurology | Admitting: Physical Therapy

## 2020-09-05 ENCOUNTER — Other Ambulatory Visit: Payer: Self-pay

## 2020-09-05 ENCOUNTER — Encounter: Payer: Self-pay | Admitting: Physical Therapy

## 2020-09-05 DIAGNOSIS — R2689 Other abnormalities of gait and mobility: Secondary | ICD-10-CM | POA: Diagnosis present

## 2020-09-05 DIAGNOSIS — R2681 Unsteadiness on feet: Secondary | ICD-10-CM | POA: Insufficient documentation

## 2020-09-05 DIAGNOSIS — M6281 Muscle weakness (generalized): Secondary | ICD-10-CM | POA: Diagnosis not present

## 2020-09-05 NOTE — Therapy (Signed)
[facility] [address] [place], [place], [address] Phone: [phone]   Fax:  [phone]  Physical Therapy Treatment  Patient Details  Name: Tabitha Andrews MRN: [id number] Date of Birth: 25-Oct-1965 Referring Provider (PT): Allena Earing   Encounter Date: 09/05/2020   PT End of Session - 09/05/20 1352    Visit Number 2    Number of Visits 13    Authorization Type BCBS    PT Start Time 1230    PT Stop Time 1315    PT Time Calculation (min) 45 min    Equipment Utilized During Treatment Gait belt    Activity Tolerance Patient tolerated treatment well    Behavior During Therapy [facility] for tasks assessed/performed           History reviewed. No pertinent past medical history.  Past Surgical History:  Procedure Laterality Date  . CHOLECYSTECTOMY    . UTERINE FIBROID SURGERY N/A     There were no vitals filed for this visit.   Subjective Assessment - 09/05/20 1239    Subjective Traveled for the holidays; came down with COVID while traveling.  Had a very mild case and has recovered fully.  Had a fall out of bed on Friday; scratched on her side.  No soreness or pain. Brought cane and asking to have it adjusted to proper height.  Gilmer Mor was her husband's, this is her first time really using it.  Hopes to not use it very long.  Had MRI; no new lesions.    Patient Stated Goals I want to be able to walk as close as normal as possible.    Currently in Pain? No/denies              [facility] PT Assessment - 09/05/20 1244      Strength   Overall Strength Deficits    Overall Strength Comments --    Right/Left Hip Left;Right    Right Hip Extension 4/5    Right Hip ABduction 4/5    Left Hip Extension 5/5    Left Hip ABduction 5/5      Special Tests    Special Tests Leg LengthTest    Leg length test  True      True   Right 35 in.    Left  36 in.    Comments Instructed pt in isometric broomstick exercise for mm  activation and rotation of pelvis               Vestibular Assessment - 09/05/20 1300      Symptom Behavior   Subjective history of current problem Recently dizziness has not been that bad; feels dizziness with quick head movements.  Feels dizziness and LOB in closet - looking up and around for clothes    Type of Dizziness  Imbalance    Frequency of Dizziness intermittent    Duration of Dizziness seconds    Symptom Nature Motion provoked    Aggravating Factors Looking up to the ceiling    Relieving Factors Slow movements    Progression of Symptoms Better      Oculomotor Exam   Oculomotor Alignment Abnormal    Spontaneous Absent    Gaze-induced  Absent    Smooth Pursuits Intact    Saccades Dysmetria;Overshoots;Poor trajectory    Comment Exophoria      Oculomotor Exam-Fixation Suppressed    Left Head Impulse negative    Right Head Impulse negative      Vestibulo-Ocular Reflex  VOR to Slow Head Movement Normal    VOR Cancellation Normal      Balancemaster   Balancemaster Comment MCTSIB: 20 seconds condition 1; 30 seconds condition 2; 30 seconds condition 3; 2 seconds condition 4      Positional Sensitivities   Sit to Supine No dizziness    Supine to Left Side No dizziness    Supine to Right Side No dizziness    Supine to Sitting No dizziness    Nose to Right Knee No dizziness    Right Knee to Sitting No dizziness    Nose to Left Knee No dizziness    Left Knee to Sitting No dizziness    Head Turning x 5 No dizziness    Head Nodding x 5 No dizziness    Pivot Right in Standing No dizziness    Pivot Left in Standing No dizziness    Rolling Right No dizziness    Rolling Left No dizziness                            PT Education - 09/05/20 1352    Education Details LLD measurement; exercise to address rotation, vestibular findings    Person(s) Educated Patient    Methods Explanation;Demonstration;Handout    Comprehension Verbalized  understanding;Returned demonstration            PT Short Term Goals - 07/25/20 1305      PT SHORT TERM GOAL #1   Title Pt will be independent with HEP for improved strength, balance, transfers, and gait.  TARGET 08/29/2020 (due to pt being out of town over the holiday)    Time 4    Period Weeks    Status New      PT SHORT TERM GOAL #2   Title Pt will improve TUG score to less than or equal to 13 seconds for improved balance/decreased fall risk.    Baseline 14.07    Time 4    Period Weeks    Status New      PT SHORT TERM GOAL #3   Title Sensory Organization test to be assessed, with goal to be written as appropriate.    Time 4    Period Weeks    Status New      PT SHORT TERM GOAL #4   Title Pt will improve SLS each leg to 3 seconds for improved balance for dressing, stair negoitation.    Baseline R unable first try, L 2.59 sec first try    Time 4    Period Weeks    Status New      PT SHORT TERM GOAL #5   Title Pt will improve tandem stance to at least 30 seconds each foot position to demonstrate improved hip stability for balance.    Baseline 7.5 sec, 9.7 sec    Time 4    Period Weeks    Status New      Additional Short Term Goals   Additional Short Term Goals Yes      PT SHORT TERM GOAL #6   Title Pt will verbalize understanding of fall prevention in home environment.    Time 4    Period Weeks    Status New             PT Long Term Goals - 07/25/20 1309      PT LONG TERM GOAL #1   Title Pt will be independent with progression of HEP  for improved strength, balance, transfers, and gait.  TARGET 09/12/2020    Time 6    Period Weeks    Status New      PT LONG TERM GOAL #2   Title Pt will improve gait velocity to at least 2.6 ft/sec for improved gait efficiency and safety.    Time 6    Period Weeks    Status New      PT LONG TERM GOAL #3   Title Pt will improve 5x sit<>stand to less than or equal to 9 seconds for improved functional strength and transfer  efficiency.    Time 6    Period Weeks    Status New      PT LONG TERM GOAL #4   Title Pt will ambulate at least 500 ft modified independently with assistive device as appropriate and/or orthotic for improved safety and stability with gait.    Time 6    Period Weeks    Status New                 Plan - 09/05/20 1356    Clinical Impression Statement Performed assessment of cane height and LLD due to pt reporting some back pain and difficulty clearing foot; pt does have 1 inch difference between L and R leg.  Pt noted to have some rotation of pelvis; provided exercise to address.  Proceeded with vestibular assessment with pt demonstrating impaired sensory integration.  Will initiate balance exercises next session to begin to address.    Examination-Activity Limitations Locomotion Level;Transfers;Stairs;Stand    Examination-Participation Restrictions Community Activity    Stability/Clinical Decision Making Stable/Uncomplicated    Rehab Potential Good    PT Frequency 2x / week    PT Duration 6 weeks   plus eval   PT Treatment/Interventions ADLs/Self Care Home Management;Electrical Stimulation;DME Instruction;Neuromuscular re-education;Balance training;Therapeutic exercise;Therapeutic activities;Functional mobility training;Stair training;Gait training;Patient/family education;Manual techniques;Energy conservation    PT Next Visit Plan Check STG; reset LTG; Initiate hip strengthening for RLE hip ABD and hip extension ; Initiate HEP to address SLS, tandem stance; need to do Sensory ORganization test if able (write goal); work on sensory integration with vision removed, compliant surfaces and head turns   At some point-may try foot-up/AFO/Bioness for gait training   Consulted and Agree with Plan of Care Patient           Patient will benefit from skilled therapeutic intervention in order to improve the following deficits and impairments:  Abnormal gait,Difficulty walking,Impaired  tone,Decreased endurance,Decreased balance,Decreased mobility,Decreased strength  Visit Diagnosis: Muscle weakness (generalized)  Other abnormalities of gait and mobility  Unsteadiness on feet     Problem List There are no problems to display for this patient.   Dierdre Highman, PT, DPT 09/05/20    2:01 PM     [facility] [address] [place], [place], [address] Phone: [phone]   Fax:  [phone]  Name: Tacha Manni MRN: [id number] Date of Birth: 24-Nov-1965

## 2020-09-05 NOTE — Patient Instructions (Signed)
Broomstick Exercise;  Lying on your back with knees bent Place a broom stick under the left thigh, over right thigh At the same time, press down with the left leg, up with the right leg and hold for 5 seconds. Repeat 10 times.

## 2020-09-10 ENCOUNTER — Encounter: Payer: Self-pay | Admitting: Physical Therapy

## 2020-09-10 ENCOUNTER — Other Ambulatory Visit: Payer: Self-pay

## 2020-09-10 ENCOUNTER — Ambulatory Visit: Payer: BLUE CROSS/BLUE SHIELD | Admitting: Physical Therapy

## 2020-09-10 DIAGNOSIS — M6281 Muscle weakness (generalized): Secondary | ICD-10-CM | POA: Diagnosis not present

## 2020-09-10 DIAGNOSIS — R2689 Other abnormalities of gait and mobility: Secondary | ICD-10-CM

## 2020-09-10 DIAGNOSIS — R2681 Unsteadiness on feet: Secondary | ICD-10-CM

## 2020-09-11 NOTE — Therapy (Addendum)
[facility] [address] [place], [place], [address] Phone: [phone]   Fax:  [phone]  Physical Therapy Treatment  Patient Details  Name: Tabitha Andrews MRN: [id number] Date of Birth: 1965/10/29 Referring Provider (PT): Erin Sons   Encounter Date: 09/10/2020   PT End of Session - 09/10/20 1241    Visit Number 3    Number of Visits 13    Authorization Type BCBS    PT Start Time 2297   pt running late for appt today   PT Stop Time 1315    PT Time Calculation (min) 37 min    Equipment Utilized During Treatment Gait belt    Activity Tolerance Patient tolerated treatment well    Behavior During Therapy [facility] for tasks assessed/performed           History reviewed. No pertinent past medical history.  Past Surgical History:  Procedure Laterality Date  . CHOLECYSTECTOMY    . UTERINE FIBROID SURGERY N/A     There were no vitals filed for this visit.   Subjective Assessment - 09/10/20 1239    Subjective No new complaints. Has not done the HEP, does not have a reason, states she will start doing them. No pain at this time.    Patient Stated Goals I want to be able to walk as close as normal as possible.    Currently in Pain? No/denies    Pain Score 0-No pain              [facility] PT Assessment - 09/10/20 1243      Timed Up and Go Test   TUG Normal TUG    Normal TUG (seconds) 8.81   no AD     High Level Balance   High Level Balance Comments tandem stance max hold for 25 sec's before needed UE support each foot forward for a few reps; single leg stance 3-5 sec's x 3-4 reps each LE before needing UE support           Neuro re-ed: sensory organization test performed with following results: Conditions: 1: below, below, above 2: below, above, below 3: below, below, above 4: all 3 below 5: all 3 falls 6: all 3 falls Composite score: 40 (norm ~70) Sensory Analysis Som: below Vis: below Vest:  barely on scale (below) Pref: above Strategy analysis: ankle dominant       COG alignment: right with slight posterior bias            PT Short Term Goals - 09/10/20 1241      PT SHORT TERM GOAL #1   Title Pt will be independent with HEP for improved strength, balance, transfers, and gait.  TARGET 08/29/2020 (due to pt being out of town over the holiday)    Baseline 09/10/20: has a program, has not been compliant.    Status Partially Met      PT SHORT TERM GOAL #2   Title Pt will improve TUG score to less than or equal to 13 seconds for improved balance/decreased fall risk.    Baseline 09/10/20: 8.81 sec's no AD    Time --    Period --    Status Achieved      PT SHORT TERM GOAL #3   Title Sensory Organization test to be assessed, with goal to be written as appropriate.    Baseline 09/10/20: performed this date with PT to set goals    Time --    Period --  Status Achieved      PT SHORT TERM GOAL #4   Title Pt will improve SLS each leg to 3 seconds for improved balance for dressing, stair negoitation.    Baseline 09/10/20: 3-5 sec's on each leg before needing UE support    Time --    Period --    Status Achieved      PT SHORT TERM GOAL #5   Title Pt will improve tandem stance to at least 30 seconds each foot position to demonstrate improved hip stability for balance.    Baseline 09/10/20: 25 sec's each foot position before needing UE support after a few tries. improved just not to goal level.    Time --    Period --    Status Partially Met      PT SHORT TERM GOAL #6   Title Pt will verbalize understanding of fall prevention in home environment.    Time 4    Period Weeks    Status On-going             PT Long Term Goals - 07/25/20 1309      PT LONG TERM GOAL #1   Title Pt will be independent with progression of HEP for improved strength, balance, transfers, and gait.  TARGET 09/12/2020    Time 6    Period Weeks    Status New      PT LONG TERM GOAL #2   Title Pt  will improve gait velocity to at least 2.6 ft/sec for improved gait efficiency and safety.    Time 6    Period Weeks    Status New      PT LONG TERM GOAL #3   Title Pt will improve 5x sit<>stand to less than or equal to 9 seconds for improved functional strength and transfer efficiency.    Time 6    Period Weeks    Status New      PT LONG TERM GOAL #4   Title Pt will ambulate at least 500 ft modified independently with assistive device as appropriate and/or orthotic for improved safety and stability with gait.    Time 6    Period Weeks    Status New                 Plan - 09/10/20 1241    Clinical Impression Statement Today's skilled session focused on progress toward STGs. Pt showed progress with single leg stance and tandem stance. She has an HEP to address pelvic rotation, however does report decreased compiance with performance. SOT performed this session with primary PT to set goals along with updated LTG dates due to increase time before pt was able to return to therapy. The pt is progressing and should benefit from continued PT to progress toward unmet goals.    Examination-Activity Limitations Locomotion Level;Transfers;Stairs;Stand    Examination-Participation Restrictions Community Activity    Stability/Clinical Decision Making Stable/Uncomplicated    Rehab Potential Good    PT Frequency 2x / week    PT Duration 6 weeks   plus eval   PT Treatment/Interventions ADLs/Self Care Home Management;Electrical Stimulation;DME Instruction;Neuromuscular re-education;Balance training;Therapeutic exercise;Therapeutic activities;Functional mobility training;Stair training;Gait training;Patient/family education;Manual techniques;Energy conservation    PT Next Visit Plan reset LTG and add SOT goal; Initiate hip strengthening for RLE hip ABD and hip extension ; Initiate HEP to address SLS, tandem stance, corner balance with eyes closed.  work on IT sales professional with vision removed,  compliant surfaces and head turns  At some point-may try foot-up/AFO/Bioness for gait training   Consulted and Agree with Plan of Care Patient           Patient will benefit from skilled therapeutic intervention in order to improve the following deficits and impairments:  Abnormal gait,Difficulty walking,Impaired tone,Decreased endurance,Decreased balance,Decreased mobility,Decreased strength  Visit Diagnosis: Muscle weakness (generalized)  Other abnormalities of gait and mobility  Unsteadiness on feet     Problem List There are no problems to display for this patient.   Willow Ora, PTA, [place] [address] [place], [place] [address] [phone] 09/11/20, 8:40 AM   Name: Tabitha Andrews MRN: [id number] Date of Birth: 02/02/1966   In discussion with PTA, this primary PT has extended LTG dates and has added LTG to address Sensory Organization test balance scores.  Please see below.   PT Long Term Goals - 09/12/20 1429      PT LONG TERM GOAL #1   Title Pt will be independent with progression of HEP for improved strength, balance, transfers, and gait.  TARGET (extended) 10/10/2020, due to weeks remaining in POC    Time 6    Period Weeks    Status New      PT LONG TERM GOAL #2   Title Pt will improve gait velocity to at least 2.6 ft/sec for improved gait efficiency and safety.    Time 6    Period Weeks    Status New      PT LONG TERM GOAL #3   Title Pt will improve 5x sit<>stand to less than or equal to 9 seconds for improved functional strength and transfer efficiency.    Time 6    Period Weeks    Status New      PT LONG TERM GOAL #4   Title Pt will ambulate at least 500 ft modified independently with assistive device as appropriate and/or orthotic for improved safety and stability with gait.    Time 6    Period Weeks    Status New      PT LONG TERM GOAL #5   Title Pt will improve composite balance score on Sensory  Organization Test to at least 50/100 for improved overall composite balance.    Baseline composite 40, vestibular 0; somatosensory and visual decreased compared to normal    Time 4    Period Weeks    Status Karoline Caldwell, [place] 09/12/20 2:33 PM Phone: [phone] Fax: [phone]

## 2020-09-12 ENCOUNTER — Ambulatory Visit: Payer: BLUE CROSS/BLUE SHIELD | Admitting: Physical Therapy

## 2020-09-17 ENCOUNTER — Other Ambulatory Visit: Payer: Self-pay

## 2020-09-17 ENCOUNTER — Encounter: Payer: Self-pay | Admitting: Physical Therapy

## 2020-09-17 ENCOUNTER — Ambulatory Visit: Payer: BLUE CROSS/BLUE SHIELD | Admitting: Physical Therapy

## 2020-09-17 DIAGNOSIS — M6281 Muscle weakness (generalized): Secondary | ICD-10-CM

## 2020-09-17 DIAGNOSIS — R2681 Unsteadiness on feet: Secondary | ICD-10-CM

## 2020-09-17 DIAGNOSIS — R2689 Other abnormalities of gait and mobility: Secondary | ICD-10-CM

## 2020-09-17 NOTE — Therapy (Signed)
[facility] [address] [place], [place], [address] Phone: [phone]   Fax:  [phone]  Physical Therapy Treatment  Patient Details  Name: Tabitha Andrews MRN: [id number] Date of Birth: 1965/12/31 Referring Provider (PT): Erin Sons   Encounter Date: 09/17/2020   PT End of Session - 09/17/20 1234    Visit Number 4    Number of Visits 13    Authorization Type BCBS    PT Start Time 1232    PT Stop Time 1315    PT Time Calculation (min) 43 min    Equipment Utilized During Treatment Gait belt    Activity Tolerance Patient tolerated treatment well    Behavior During Therapy [facility] for tasks assessed/performed           History reviewed. No pertinent past medical history.  Past Surgical History:  Procedure Laterality Date  . CHOLECYSTECTOMY    . UTERINE FIBROID SURGERY N/A     There were no vitals filed for this visit.   Subjective Assessment - 09/17/20 1233    Subjective No new complaints. Reports between the ex's with the broom stick and her Chiropractor visits her back/hip pain has improved.    Patient Stated Goals I want to be able to walk as close as normal as possible.    Currently in Pain? No/denies    Pain Score 0-No pain                [facility] Adult PT Treatment/Exercise - 09/17/20 1234      Transfers   Transfers Sit to Stand;Stand to Sit    Sit to Stand 5: Supervision;Without upper extremity assist;From chair/3-in-1    Stand to Sit 5: Supervision      Ambulation/Gait   Ambulation/Gait Yes    Ambulation/Gait Assistance 5: Supervision    Ambulation/Gait Assistance Details use of bil foot up braces for 1st gait rep with occasional foot scuffing noted with gait. then trialed posterior Ottobock braces to bil LE's. improved foot clearance and knee control. Pt to wear larger sneakers next session to trial them again as her current shoes were tight across toeas with use of braces today.     Ambulation Distance (Feet) 115 Feet   x2, plus around gym with session   Assistive device None;Other (Comment)   trial of braces to bil LE"s.   Gait Pattern Step-through pattern;Decreased arm swing - right;Decreased step length - right;Decreased stance time - right;Decreased hip/knee flexion - right;Decreased dorsiflexion - right;Decreased weight shift to right;Right genu recurvatum;Decreased trunk rotation;Narrow base of support    Ambulation Surface Level;Indoor      Neuro Re-ed    Neuro Re-ed Details  issued ex's to HEP to address balance with no issues noted or reported in session. cues needed on correct form/technique. Refer to [place] for full details. Min guard assist for balance with ex's.                  PT Education - 09/17/20 2312    Education Details HEP for balance; trial of foot up braces vs posterior ottobock braces    Person(s) Educated Patient    Methods Explanation;Demonstration;Verbal cues;Handout    Comprehension Verbalized understanding;Returned demonstration;Verbal cues required;Need further instruction            PT Short Term Goals - 09/10/20 1241      PT SHORT TERM GOAL #1   Title Pt will be independent with HEP for improved strength, balance, transfers, and gait.  TARGET 08/29/2020 (due to pt being out of town over the holiday)    Baseline 09/10/20: has a program, has not been compliant.    Status Partially Met      PT SHORT TERM GOAL #2   Title Pt will improve TUG score to less than or equal to 13 seconds for improved balance/decreased fall risk.    Baseline 09/10/20: 8.81 sec's no AD    Time --    Period --    Status Achieved      PT SHORT TERM GOAL #3   Title Sensory Organization test to be assessed, with goal to be written as appropriate.    Baseline 09/10/20: performed this date with PT to set goals    Time --    Period --    Status Achieved      PT SHORT TERM GOAL #4   Title Pt will improve SLS each leg to 3 seconds for improved balance  for dressing, stair negoitation.    Baseline 09/10/20: 3-5 sec's on each leg before needing UE support    Time --    Period --    Status Achieved      PT SHORT TERM GOAL #5   Title Pt will improve tandem stance to at least 30 seconds each foot position to demonstrate improved hip stability for balance.    Baseline 09/10/20: 25 sec's each foot position before needing UE support after a few tries. improved just not to goal level.    Time --    Period --    Status Partially Met      PT SHORT TERM GOAL #6   Title Pt will verbalize understanding of fall prevention in home environment.    Time 4    Period Weeks    Status On-going             PT Long Term Goals - 09/12/20 1429      PT LONG TERM GOAL #1   Title Pt will be independent with progression of HEP for improved strength, balance, transfers, and gait.  TARGET (extended) 10/10/2020, due to weeks remaining in POC    Time 6    Period Weeks    Status New      PT LONG TERM GOAL #2   Title Pt will improve gait velocity to at least 2.6 ft/sec for improved gait efficiency and safety.    Time 6    Period Weeks    Status New      PT LONG TERM GOAL #3   Title Pt will improve 5x sit<>stand to less than or equal to 9 seconds for improved functional strength and transfer efficiency.    Time 6    Period Weeks    Status New      PT LONG TERM GOAL #4   Title Pt will ambulate at least 500 ft modified independently with assistive device as appropriate and/or orthotic for improved safety and stability with gait.    Time 6    Period Weeks    Status New      PT LONG TERM GOAL #5   Title Pt will improve composite balance score on Sensory Organization Test to at least 50/100 for improved overall composite balance.    Baseline composite 40, vestibular 0; somatosensory and visual decreased compared to normal    Time 4    Period Weeks    Status New  Plan - 09/17/20 1234    Clinical Impression Statement Today'  skilled session initially focused on establishment of an HEP to address balance with focus on increased vestibular imput. No issues noted or reported with performance in session. Remainder of session focused on trial of foot up braces and posterior Ottobock braces to bil LE's. Pt with best improvement with gait with use of posterior Ottobock however they were tight on the top of her feet due to small shoes. Pt has larger sneakers she is bringing next session to try these braces again. The pt is progressing toward goals and should benefit from continued PT to progress toward unmet goals.    Examination-Activity Limitations Locomotion Level;Transfers;Stairs;Stand    Examination-Participation Restrictions Community Activity    Stability/Clinical Decision Making Stable/Uncomplicated    Rehab Potential Good    PT Frequency 2x / week    PT Duration 6 weeks   plus eval   PT Treatment/Interventions ADLs/Self Care Home Management;Electrical Stimulation;DME Instruction;Neuromuscular re-education;Balance training;Therapeutic exercise;Therapeutic activities;Functional mobility training;Stair training;Gait training;Patient/family education;Manual techniques;Energy conservation    PT Next Visit Plan add ex's for hip strengthening for RLE hip ABD and hip extension to HEP ;  Try posterior Ottoback braces again to bil LE"s. work on IT sales professional with vision removed, compliant surfaces and head turns. ? Bioness to bil LE's for strentening/muscle activation concurrent with ex's and gait.   At some point-may try foot-up/AFO/Bioness for gait training   Consulted and Agree with Plan of Care Patient           Patient will benefit from skilled therapeutic intervention in order to improve the following deficits and impairments:  Abnormal gait,Difficulty walking,Impaired tone,Decreased endurance,Decreased balance,Decreased mobility,Decreased strength  Visit Diagnosis: Muscle weakness (generalized)  Other  abnormalities of gait and mobility  Unsteadiness on feet     Problem List There are no problems to display for this patient.   Willow Ora, PTA, [facility] [address] [place], [place] [address] [phone] 09/18/20, 9:46 AM   Name: Tabitha Andrews MRN: [id number] Date of Birth: 09/11/1965

## 2020-09-17 NOTE — Patient Instructions (Signed)
Access Code: [facility] URL: [email or web address] Date: 09/17/2020 Prepared by: Sallyanne Kuster  Exercises Standing Near Stance in [place] with Eyes Closed - 1 x daily - 5 x weekly - 1 sets - 3 reps - 30 hold Wide Tandem Stance with Eyes Closed - 1 x daily - 5 x weekly - 1 sets - 3 reps - 30 hold Wide Stance with Head Rotation on Foam Pad - 1 x daily - 5 x weekly - 1 sets - 10 reps

## 2020-09-19 ENCOUNTER — Other Ambulatory Visit: Payer: Self-pay

## 2020-09-19 ENCOUNTER — Encounter: Payer: Self-pay | Admitting: Physical Therapy

## 2020-09-19 ENCOUNTER — Ambulatory Visit: Payer: BLUE CROSS/BLUE SHIELD | Admitting: Physical Therapy

## 2020-09-19 DIAGNOSIS — R2681 Unsteadiness on feet: Secondary | ICD-10-CM

## 2020-09-19 DIAGNOSIS — M6281 Muscle weakness (generalized): Secondary | ICD-10-CM | POA: Diagnosis not present

## 2020-09-19 DIAGNOSIS — R2689 Other abnormalities of gait and mobility: Secondary | ICD-10-CM

## 2020-09-19 NOTE — Therapy (Signed)
[facility] [address] [place], [place], [address] Phone: [phone]   Fax:  [phone]  Physical Therapy Treatment  Patient Details  Name: Shaniqua Guillot MRN: [id number] Date of Birth: [birth year] Referring Provider (PT): Erin Sons   Encounter Date: 09/19/2020   PT End of Session - 09/19/20 1443    Visit Number 5    Number of Visits 13    Authorization Type BCBS    PT Start Time 1237   pt arrives late   PT Stop Time 1315    PT Time Calculation (min) 38 min    Equipment Utilized During Treatment Gait belt    Activity Tolerance Patient tolerated treatment well    Behavior During Therapy [facility] for tasks assessed/performed           History reviewed. No pertinent past medical history.  Past Surgical History:  Procedure Laterality Date  . CHOLECYSTECTOMY    . UTERINE FIBROID SURGERY N/A     There were no vitals filed for this visit.   Subjective Assessment - 09/19/20 1239    Subjective Did have a fall yesterday.  Tried to stand from the low stool, and fell onto knees and stood back up.  Hadn't had a chance to try the corner exercises yet.    Patient Stated Goals I want to be able to walk as close as normal as possible.    Currently in Pain? No/denies                 Reviewed HEP from last visit:  Access Code: [facility] URL: [email or web address] Date: 09/17/2020 Prepared by: Mady Haagensen  Exercises Standing Near Stance in [place] with Eyes Closed - 1 x daily - 5 x weekly - 1 sets - 3 reps - 30 hold (performed 2 reps, 30 seconds-cues to hold UE support at chair for steadiness) Wide Tandem Stance with Eyes Closed - 1 x daily - 5 x weekly - 1 sets - 3 reps - 30 hold  Wide Stance with Head Rotation on Foam Pad - 1 x daily - 5 x weekly - 1 sets - 10 reps    Cues for UE support as needed initially to be more steady with exercises.         [facility] Adult PT  Treatment/Exercise - 09/19/20 0001      Transfers   Transfers Sit to Stand;Stand to Sit    Sit to Stand 5: Supervision;From bed    Sit to Stand Details Verbal cues for sequencing;Verbal cues for technique    Sit to Stand Details (indicate cue type and reason) Cues for foot placement, and for avoiding R knee recurvatum upon standing.    Stand to Sit 5: Supervision;With upper extremity assist;To bed    Number of Reps 2 sets;Other reps (comment)   5 reps wearing bilat AFOs; 5 reps no AFO     Ambulation/Gait   Ambulation/Gait Yes    Ambulation/Gait Assistance 5: Supervision    Ambulation/Gait Assistance Details Pt brings in larger (husband's) shoes to wear today. PT assists with donning of bilateral Ottobach posterior AFOs, with 2 gait trials.  Needed to tighten shoes (as they are too big for patient), with slightly better success second bout of gait.  Explained rationale of AFOs for improved stability and discussed options for AFOs in the future.  Pt to think about this-for now will try to use in PT sessions.    Ambulation Distance (Feet) 115 Feet   x 2; 40  ft x 4   Assistive device None;Other (Comment)   trial bilateral clinic AFOs   Gait Pattern Step-through pattern;Decreased arm swing - right;Decreased step length - right;Decreased stance time - right;Decreased hip/knee flexion - right;Decreased dorsiflexion - right;Decreased weight shift to right;Right genu recurvatum;Decreased trunk rotation;Narrow base of support    Ambulation Surface Level    Gait Comments With AFOs, pt demo improved foot clearance, improved heelstrike-does has some knee recurvatum RLE.      Exercises   Exercises Knee/Hip      Knee/Hip Exercises: Standing   Terminal Knee Extension Strengthening;Right;Left;3 sets;10 reps    Theraband Level (Terminal Knee Extension) Level 1 (Yellow)    Terminal Knee Extension Limitations 3 positions each leg, BUE support at parallel bars; tactile cues given at posterior R knee to avoid  hyperextension with exercise and VCs given to slow eccentric control into knee flexion.    Other Standing Knee Exercises Sidestepping in parallel bars x 3 reps 10 ft, R and L for hip strengthening; backwards walking in parallel bars with focus on hip extension, x 5 reps for improved hip strength.                    PT Short Term Goals - 09/10/20 1241      PT SHORT TERM GOAL #1   Title Pt will be independent with HEP for improved strength, balance, transfers, and gait.  TARGET 08/29/2020 (due to pt being out of town over the holiday)    Baseline 09/10/20: has a program, has not been compliant.    Status Partially Met      PT SHORT TERM GOAL #2   Title Pt will improve TUG score to less than or equal to 13 seconds for improved balance/decreased fall risk.    Baseline 09/10/20: 8.81 sec's no AD    Time --    Period --    Status Achieved      PT SHORT TERM GOAL #3   Title Sensory Organization test to be assessed, with goal to be written as appropriate.    Baseline 09/10/20: performed this date with PT to set goals    Time --    Period --    Status Achieved      PT SHORT TERM GOAL #4   Title Pt will improve SLS each leg to 3 seconds for improved balance for dressing, stair negoitation.    Baseline 09/10/20: 3-5 sec's on each leg before needing UE support    Time --    Period --    Status Achieved      PT SHORT TERM GOAL #5   Title Pt will improve tandem stance to at least 30 seconds each foot position to demonstrate improved hip stability for balance.    Baseline 09/10/20: 25 sec's each foot position before needing UE support after a few tries. improved just not to goal level.    Time --    Period --    Status Partially Met      PT SHORT TERM GOAL #6   Title Pt will verbalize understanding of fall prevention in home environment.    Time 4    Period Weeks    Status On-going             PT Long Term Goals - 09/12/20 1429      PT LONG TERM GOAL #1   Title Pt will be  independent with progression of HEP for improved strength, balance, transfers, and gait.  TARGET (extended) 10/10/2020, due to weeks remaining in POC    Time 6    Period Weeks    Status New      PT LONG TERM GOAL #2   Title Pt will improve gait velocity to at least 2.6 ft/sec for improved gait efficiency and safety.    Time 6    Period Weeks    Status New      PT LONG TERM GOAL #3   Title Pt will improve 5x sit<>stand to less than or equal to 9 seconds for improved functional strength and transfer efficiency.    Time 6    Period Weeks    Status New      PT LONG TERM GOAL #4   Title Pt will ambulate at least 500 ft modified independently with assistive device as appropriate and/or orthotic for improved safety and stability with gait.    Time 6    Period Weeks    Status New      PT LONG TERM GOAL #5   Title Pt will improve composite balance score on Sensory Organization Test to at least 50/100 for improved overall composite balance.    Baseline composite 40, vestibular 0; somatosensory and visual decreased compared to normal    Time 4    Period Weeks    Status New                 Plan - 09/19/20 1443    Clinical Impression Statement Reviewed HEP and continued to work with standing strengthening and gait exercises.  Utilized bilat ottobach braces for gait and standing strengthening exercises.  Pt noted improved awareness and attention to R knee recurvatum-able to better control with and without wearing AFO at end of session with sit<>stand.  Overall, the braces help wtih foot clearance and stability with gait; may continue to use in PT sessions and ask if pt would like to pursue through orthotist.    Examination-Activity Limitations Locomotion Level;Transfers;Stairs;Stand    Examination-Participation Restrictions Community Activity    Stability/Clinical Decision Making Stable/Uncomplicated    Rehab Potential Good    PT Frequency 2x / week    PT Duration 6 weeks   plus eval    PT Treatment/Interventions ADLs/Self Care Home Management;Electrical Stimulation;DME Instruction;Neuromuscular re-education;Balance training;Therapeutic exercise;Therapeutic activities;Functional mobility training;Stair training;Gait training;Patient/family education;Manual techniques;Energy conservation    PT Next Visit Plan add ex's for hip strengthening for RLE hip ABD and hip extension to HEP ; terminal knee extension and sit<>stand; Try posterior Ottoback braces again to bil LE"s. work on IT sales professional with vision removed, compliant surfaces and head turns. ? Bioness to bil LE's for strentening/muscle activation concurrent with ex's and gait.   At some point-may try foot-up/AFO/Bioness for gait training   Consulted and Agree with Plan of Care Patient           Patient will benefit from skilled therapeutic intervention in order to improve the following deficits and impairments:  Abnormal gait,Difficulty walking,Impaired tone,Decreased endurance,Decreased balance,Decreased mobility,Decreased strength  Visit Diagnosis: Unsteadiness on feet  Other abnormalities of gait and mobility  Muscle weakness (generalized)     Problem List There are no problems to display for this patient.   MARRIOTT,AMY W. 09/19/2020, 2:46 PM  Frazier Butt., PT   [facility] [address] [place], [place], [address] Phone: [phone]   Fax:  [phone]  Name: Fauna Neuner MRN: [id number] Date of Birth: Jan 11, 1966

## 2020-09-24 ENCOUNTER — Encounter: Payer: Self-pay | Admitting: Physical Therapy

## 2020-09-24 ENCOUNTER — Other Ambulatory Visit: Payer: Self-pay

## 2020-09-24 ENCOUNTER — Ambulatory Visit: Payer: BLUE CROSS/BLUE SHIELD | Attending: Neurology | Admitting: Physical Therapy

## 2020-09-24 DIAGNOSIS — M6281 Muscle weakness (generalized): Secondary | ICD-10-CM | POA: Diagnosis present

## 2020-09-24 DIAGNOSIS — R2681 Unsteadiness on feet: Secondary | ICD-10-CM | POA: Insufficient documentation

## 2020-09-24 DIAGNOSIS — R2689 Other abnormalities of gait and mobility: Secondary | ICD-10-CM | POA: Diagnosis present

## 2020-09-24 NOTE — Patient Instructions (Signed)
Access Code: [facility] URL: [email or web address] Date: 09/24/2020 Prepared by: Sallyanne Kuster  Program Notes Do your HEP 3 days a week on days you do not come to therapy.  When you start at the gym: work on seated cardio only for now. As PT backs the ex's from your home program away we will add in machines, gym ex's for strengthening and balance. Be mindful not to over fatigue your body.    Exercises Standing Near Stance in [place] with Eyes Closed - 1 x daily - 5 x weekly - 1 sets - 3 reps - 30 hold Wide Tandem Stance with Eyes Closed - 1 x daily - 5 x weekly - 1 sets - 3 reps - 30 hold Wide Stance with Head Rotation on Foam Pad - 1 x daily - 5 x weekly - 1 sets - 10 reps  Added these to program today:  Sit to Stand with Armchair - 1 x daily - 5 x weekly - 1 sets - 10 reps Side Stepping with Counter Support - 1 x daily - 5 x weekly - 1 sets - 3 reps Backward Walking with Counter Support - 1 x daily - 5 x weekly - 1 sets - 3 reps

## 2020-09-25 NOTE — Therapy (Signed)
[facility] [address] [place], [place], [address] Phone: [phone]   Fax:  [phone]  Physical Therapy Treatment  Patient Details  Name: Tabitha Andrews MRN: [id number] Date of Birth: 1965-09-02 Referring Provider (PT): Erin Sons   Encounter Date: 09/24/2020   PT End of Session - 09/24/20 1235    Visit Number 6    Number of Visits 13    Authorization Type BCBS    PT Start Time 1233    PT Stop Time 1314    PT Time Calculation (min) 41 min    Equipment Utilized During Treatment Gait belt    Activity Tolerance Patient tolerated treatment well    Behavior During Therapy [facility] for tasks assessed/performed           History reviewed. No pertinent past medical history.  Past Surgical History:  Procedure Laterality Date  . CHOLECYSTECTOMY    . UTERINE FIBROID SURGERY N/A     There were no vitals filed for this visit.   Subjective Assessment - 09/24/20 1233    Subjective No new complaitns. Did have a fall earlier today. At [facility] office and sat on a wobble cushion on the chair which moved. She was not stable and fell off. Husband helped her back up. Did not get hurt. No pain    Patient Stated Goals I want to be able to walk as close as normal as possible.    Currently in Pain? No/denies    Pain Score 0-No pain                    [facility] Adult PT Treatment/Exercise - 09/24/20 1237      Transfers   Transfers Sit to Stand;Stand to Sit    Sit to Stand 5: Supervision;From bed    Stand to Sit 5: Supervision;With upper extremity assist;To bed      Ambulation/Gait   Ambulation/Gait Yes    Ambulation/Gait Assistance 5: Supervision    Ambulation/Gait Assistance Details continued with use of posterior Ottobock shoes to bil LE's. continued cues needed for increased bil hip/knee flexion for heel strike due to heel drag noted, right>left. pt also noted to continue to have mild genurecurvatum on  right LE in stance as well. cues for reciprocal arm swing and to "relax" trunk/UE's. Does report legs feel "heavy" with use of braces and spouses shoes with increased toe scuffing noted as session progressed    Ambulation Distance (Feet) 230 Feet   x2, plus around gym with session   Assistive device None;Other (Comment)   bil posterior Ottobock braces   Gait Pattern Step-through pattern;Decreased arm swing - right;Decreased step length - right;Decreased stance time - right;Decreased hip/knee flexion - right;Decreased dorsiflexion - right;Decreased weight shift to right;Right genu recurvatum;Decreased trunk rotation;Narrow base of support    Ambulation Surface Level;Indoor               Balance Exercises - 09/24/20 1304      Balance Exercises: Standing   Standing Eyes Opened Foam/compliant surface;Limitations    Standing Eyes Opened Limitations on airex with UE support: heel<>toe raises, alternating slow marching, and mini squats for 10 reps each with cues on form/technique.    Standing Eyes Closed Foam/compliant surface;Wide (BOA);3 reps;30 secs;Limitations   feet hip width apart   Standing Eyes Closed Limitations on airex with no UE support: EC for 30 sec's x 3 reps with min guard to min assist for balance.  PT Short Term Goals - 09/10/20 1241      PT SHORT TERM GOAL #1   Title Pt will be independent with HEP for improved strength, balance, transfers, and gait.  TARGET 08/29/2020 (due to pt being out of town over the holiday)    Baseline 09/10/20: has a program, has not been compliant.    Status Partially Met      PT SHORT TERM GOAL #2   Title Pt will improve TUG score to less than or equal to 13 seconds for improved balance/decreased fall risk.    Baseline 09/10/20: 8.81 sec's no AD    Time --    Period --    Status Achieved      PT SHORT TERM GOAL #3   Title Sensory Organization test to be assessed, with goal to be written as appropriate.    Baseline 09/10/20:  performed this date with PT to set goals    Time --    Period --    Status Achieved      PT SHORT TERM GOAL #4   Title Pt will improve SLS each leg to 3 seconds for improved balance for dressing, stair negoitation.    Baseline 09/10/20: 3-5 sec's on each leg before needing UE support    Time --    Period --    Status Achieved      PT SHORT TERM GOAL #5   Title Pt will improve tandem stance to at least 30 seconds each foot position to demonstrate improved hip stability for balance.    Baseline 09/10/20: 25 sec's each foot position before needing UE support after a few tries. improved just not to goal level.    Time --    Period --    Status Partially Met      PT SHORT TERM GOAL #6   Title Pt will verbalize understanding of fall prevention in home environment.    Time 4    Period Weeks    Status On-going             PT Long Term Goals - 09/12/20 1429      PT LONG TERM GOAL #1   Title Pt will be independent with progression of HEP for improved strength, balance, transfers, and gait.  TARGET (extended) 10/10/2020, due to weeks remaining in POC    Time 6    Period Weeks    Status New      PT LONG TERM GOAL #2   Title Pt will improve gait velocity to at least 2.6 ft/sec for improved gait efficiency and safety.    Time 6    Period Weeks    Status New      PT LONG TERM GOAL #3   Title Pt will improve 5x sit<>stand to less than or equal to 9 seconds for improved functional strength and transfer efficiency.    Time 6    Period Weeks    Status New      PT LONG TERM GOAL #4   Title Pt will ambulate at least 500 ft modified independently with assistive device as appropriate and/or orthotic for improved safety and stability with gait.    Time 6    Period Weeks    Status New      PT LONG TERM GOAL #5   Title Pt will improve composite balance score on Sensory Organization Test to at least 50/100 for improved overall composite balance.    Baseline composite 40, vestibular 0;  somatosensory  and visual decreased compared to normal    Time 4    Period Weeks    Status New                 Plan - 09/24/20 1236    Clinical Impression Statement Today's skilled session continued with use of posterior Ottobock braces with gait. Pt reporting braces or shoes (wearing spouse's sneakers) made her legs feel heavy with toe scuffing noted at times. Braces removed for balance traning on comlpliant surfaces with up to min assist needed. The pt is progressing and should benefit from continued PT to progress toward unmet goals.    Examination-Activity Limitations Locomotion Level;Transfers;Stairs;Stand    Examination-Participation Restrictions Community Activity    Stability/Clinical Decision Making Stable/Uncomplicated    Rehab Potential Good    PT Frequency 2x / week    PT Duration 6 weeks   plus eval   PT Treatment/Interventions ADLs/Self Care Home Management;Electrical Stimulation;DME Instruction;Neuromuscular re-education;Balance training;Therapeutic exercise;Therapeutic activities;Functional mobility training;Stair training;Gait training;Patient/family education;Manual techniques;Energy conservation    PT Next Visit Plan Try posterior Ottoback braces again to bil LE"s. work on IT sales professional with vision removed, compliant surfaces and head turns. ? Bioness to bil LE's for strentening/muscle activation concurrent with ex's and gait.   At some point-may try foot-up/AFO/Bioness for gait training   PT Home Exercise Plan Access Code: [place] and Agree with Plan of Care Patient           Patient will benefit from skilled therapeutic intervention in order to improve the following deficits and impairments:  Abnormal gait,Difficulty walking,Impaired tone,Decreased endurance,Decreased balance,Decreased mobility,Decreased strength  Visit Diagnosis: Unsteadiness on feet  Other abnormalities of gait and mobility  Muscle weakness (generalized)     Problem  List There are no problems to display for this patient.   Willow Ora, PTA, [facility] [address], [place], [place] [address] [phone] 09/25/20, 6:57 PM   Name: Tabitha Andrews MRN: [id number] Date of Birth: Jul 28, 1966

## 2020-09-26 ENCOUNTER — Other Ambulatory Visit: Payer: Self-pay

## 2020-09-26 ENCOUNTER — Encounter: Payer: Self-pay | Admitting: Physical Therapy

## 2020-09-26 ENCOUNTER — Ambulatory Visit: Payer: BLUE CROSS/BLUE SHIELD | Admitting: Physical Therapy

## 2020-09-26 DIAGNOSIS — R2681 Unsteadiness on feet: Secondary | ICD-10-CM | POA: Diagnosis not present

## 2020-09-26 DIAGNOSIS — R2689 Other abnormalities of gait and mobility: Secondary | ICD-10-CM

## 2020-09-26 DIAGNOSIS — M6281 Muscle weakness (generalized): Secondary | ICD-10-CM

## 2020-09-28 NOTE — Therapy (Signed)
[facility] [address] [place], [place], [address] Phone: [phone]   Fax:  [phone]  Physical Therapy Treatment  Patient Details  Name: Tabitha Andrews MRN: [id number] Date of Birth: 1965/09/20 Referring Provider (PT): Erin Sons   Encounter Date: 09/26/2020     09/26/20 1233  PT Visits / Re-Eval  Visit Number 7  Number of Visits 13  Authorization  Authorization Type BCBS  PT Time Calculation  PT Start Time 1232  PT Stop Time 1315  PT Time Calculation (min) 43 min  PT - End of Session  Equipment Utilized During Treatment Gait belt  Activity Tolerance Patient tolerated treatment well  Behavior During Therapy [facility] for tasks assessed/performed    History reviewed. No pertinent past medical history.  Past Surgical History:  Procedure Laterality Date  . CHOLECYSTECTOMY    . UTERINE FIBROID SURGERY N/A     There were no vitals filed for this visit.     09/26/20 1232  Symptoms/Limitations  Subjective No new complaints. No falls or pain to report.  Patient Stated Goals I want to be able to walk as close as normal as possible.  Pain Assessment  Pain Score 0      09/26/20 1234  Transfers  Transfers Sit to Stand;Stand to Sit  Sit to Stand 5: Supervision;From bed  Stand to Sit 5: Supervision;With upper extremity assist;To bed  Ambulation/Gait  Ambulation/Gait Yes  Ambulation/Gait Assistance 5: Supervision;4: Min guard  Ambulation/Gait Assistance Details 1st gait rep with bil posterior Ottobock braces with min guard assist needed at time due to veering/scissoring of feet with gait. pt continues to report a "heavy" feeling with the braces. 2cd lap with bil foot up braces with small heel wedge in right to decrease recurvatum. supervision with improved gait control noted, however continued to note foot scuffing with swing phase.  Ambulation Distance (Feet) 230 Feet (x2, plus around gym with  session)  Assistive device None;Other (Comment)  Gait Pattern Step-through pattern;Decreased arm swing - right;Decreased step length - right;Decreased stance time - right;Decreased hip/knee flexion - right;Decreased dorsiflexion - right;Decreased weight shift to right;Right genu recurvatum;Decreased trunk rotation;Narrow base of support  Ambulation Surface Level;Indoor  Ramp 5: Supervision;Other (comment) (min guard assist)  Ramp Details (indicate cue type and reason) x3 reps. min guard with 1st rep due to right toes catching. supervision with next two reps.      09/26/20 1307  Balance Exercises: Standing  Balance Beam standing across blue foam beam- alternating foward heel tap to floor/back onto beam, then alteranting bacwward toe taps to floor/back onto beam for ~10- reps each side with no UE support, occasional touch to bars as needed for balance. Min guard to min assist for balance.  Tandem Gait Forward;Retro;Intermittent upper extremity support;Foam/compliant surface;3 reps;Limitations  Tandem Gait Limitations on blue foam beam with occasional touch to bars for balance. cues for step placement on beam.  Sidestepping Foam/compliant support;3 reps;Limitations;Upper extremity support  Sidestepping Limitations on blue foam beam with light UE support as needed on bars. cues to fully lift foot with each step, not slide it along.        PT Short Term Goals - 09/10/20 1241      PT SHORT TERM GOAL #1   Title Pt will be independent with HEP for improved strength, balance, transfers, and gait.  TARGET 08/29/2020 (due to pt being out of town over the holiday)    Baseline 09/10/20: has a program, has not been compliant.  Status Partially Met      PT SHORT TERM GOAL #2   Title Pt will improve TUG score to less than or equal to 13 seconds for improved balance/decreased fall risk.    Baseline 09/10/20: 8.81 sec's no AD    Time --    Period --    Status Achieved      PT SHORT TERM GOAL #3    Title Sensory Organization test to be assessed, with goal to be written as appropriate.    Baseline 09/10/20: performed this date with PT to set goals    Time --    Period --    Status Achieved      PT SHORT TERM GOAL #4   Title Pt will improve SLS each leg to 3 seconds for improved balance for dressing, stair negoitation.    Baseline 09/10/20: 3-5 sec's on each leg before needing UE support    Time --    Period --    Status Achieved      PT SHORT TERM GOAL #5   Title Pt will improve tandem stance to at least 30 seconds each foot position to demonstrate improved hip stability for balance.    Baseline 09/10/20: 25 sec's each foot position before needing UE support after a few tries. improved just not to goal level.    Time --    Period --    Status Partially Met      PT SHORT TERM GOAL #6   Title Pt will verbalize understanding of fall prevention in home environment.    Time 4    Period Weeks    Status On-going             PT Long Term Goals - 09/12/20 1429      PT LONG TERM GOAL #1   Title Pt will be independent with progression of HEP for improved strength, balance, transfers, and gait.  TARGET (extended) 10/10/2020, due to weeks remaining in POC    Time 6    Period Weeks    Status New      PT LONG TERM GOAL #2   Title Pt will improve gait velocity to at least 2.6 ft/sec for improved gait efficiency and safety.    Time 6    Period Weeks    Status New      PT LONG TERM GOAL #3   Title Pt will improve 5x sit<>stand to less than or equal to 9 seconds for improved functional strength and transfer efficiency.    Time 6    Period Weeks    Status New      PT LONG TERM GOAL #4   Title Pt will ambulate at least 500 ft modified independently with assistive device as appropriate and/or orthotic for improved safety and stability with gait.    Time 6    Period Weeks    Status New      PT LONG TERM GOAL #5   Title Pt will improve composite balance score on Sensory  Organization Test to at least 50/100 for improved overall composite balance.    Baseline composite 40, vestibular 0; somatosensory and visual decreased compared to normal    Time 4    Period Weeks    Status New             09/26/20 1233  Plan  Clinical Impression Statement Today's skilled session initially continued to focus on comparison of posterior Ottobock braces vs foot up braces with right heel  wedge. Pt continues to feel the Ottobock braces are too heavy, however the foot up braces continue to demo foot scuffing at times. Another item of comparrison is tha that with the braces pt tends to veer with an unsteady gait, scissoring at times. This did not occur with the use of foot up braces. Remainder of session continued to focus on balance and ankle strengthening with no issues noted or reported. The pt is progressing toward goals and should benefit from continued PT to progress toward unmet goals.  Examination-Activity Limitations Locomotion Level;Transfers;Stairs;Stand  Examination-Participation Restrictions Community Activity  Pt will benefit from skilled therapeutic intervention in order to improve on the following deficits Abnormal gait;Difficulty walking;Impaired tone;Decreased endurance;Decreased balance;Decreased mobility;Decreased strength  Stability/Clinical Decision Making Stable/Uncomplicated  Rehab Potential Good  PT Frequency 2x / week  PT Duration 6 weeks (plus eval)  PT Treatment/Interventions ADLs/Self Care Home Management;DME Instruction;Neuromuscular re-education;Balance training;Therapeutic exercise;Therapeutic activities;Functional mobility training;Stair training;Gait training;Patient/family education;Manual techniques;Energy conservation  PT Next Visit Plan work on sensory integration with vision removed, compliant surfaces and head turns. ? Bioness to bil LE's for strentening/muscle activation concurrent with ex's and gait. continue with use of foot up braces with  small heel wedge as pt prefers these to Ottobock braces. (At some point-may try foot-up/AFO/Bioness for gait training)  PT Home Exercise Plan Access Code: [place] and Agree with Plan of Care Patient          Patient will benefit from skilled therapeutic intervention in order to improve the following deficits and impairments:  Abnormal gait,Difficulty walking,Impaired tone,Decreased endurance,Decreased balance,Decreased mobility,Decreased strength  Visit Diagnosis: Unsteadiness on feet  Other abnormalities of gait and mobility  Muscle weakness (generalized)     Problem List There are no problems to display for this patient.   Willow Ora, PTA, [facility] [address] [place], [place] [address] [phone] 09/28/20, 10:01 PM   Name: Tabitha Andrews MRN: [id number] Date of Birth: 1966-05-27

## 2020-10-01 ENCOUNTER — Ambulatory Visit: Payer: BLUE CROSS/BLUE SHIELD | Admitting: Physical Therapy

## 2020-10-02 ENCOUNTER — Encounter: Payer: Self-pay | Admitting: Rehabilitation

## 2020-10-02 ENCOUNTER — Other Ambulatory Visit: Payer: Self-pay

## 2020-10-02 ENCOUNTER — Ambulatory Visit: Payer: BLUE CROSS/BLUE SHIELD | Admitting: Rehabilitation

## 2020-10-02 DIAGNOSIS — R2681 Unsteadiness on feet: Secondary | ICD-10-CM | POA: Diagnosis not present

## 2020-10-02 DIAGNOSIS — M6281 Muscle weakness (generalized): Secondary | ICD-10-CM

## 2020-10-02 DIAGNOSIS — R2689 Other abnormalities of gait and mobility: Secondary | ICD-10-CM

## 2020-10-02 NOTE — Therapy (Signed)
[facility] [address] [place], [place], [address] Phone: [phone]   Fax:  [phone]  Physical Therapy Treatment  Patient Details  Name: Tabitha Andrews MRN: [id number] Date of Birth: Jul 19, 1966 Referring Provider (PT): Erin Sons   Encounter Date: 10/02/2020   PT End of Session - 10/02/20 1145    Visit Number 8    Number of Visits 13    Authorization Type BCBS    PT Start Time 1020    PT Stop Time 1103    PT Time Calculation (min) 43 min    Equipment Utilized During Treatment Gait belt    Activity Tolerance Patient tolerated treatment well    Behavior During Therapy [facility] for tasks assessed/performed           History reviewed. No pertinent past medical history.  Past Surgical History:  Procedure Laterality Date  . CHOLECYSTECTOMY    . UTERINE FIBROID SURGERY N/A     There were no vitals filed for this visit.   Subjective Assessment - 10/02/20 1024    Subjective Pt reports doing well, no changes.    Patient Stated Goals I want to be able to walk as close as normal as possible.    Currently in Pain? No/denies                             [facility] Adult PT Treatment/Exercise - 10/02/20 1030      Neuro Re-ed    Neuro Re-ed Details  High level balance to elicit both ankle and hip strategy and address limits of stability:  In // bars on small rocker board biased vertically maintaining balance with feet apart x 3 reps of 20 secs.  This was very difficult for pt as she tends to lose balance posteriorly and is unable to recover without UE support.  Attempted slightly wider BOS with head turns, however this was also very difficult without UE support, therefore moved to limits of stability working anterior and posterior on rocker board x 2 sets of 20 reps with max cues for improved forward weight shift and being able to shift posteriorly without LOB.  Pt improved within task.  Transitioned  to standing on incline (facing upward) on blue therapy mat on ramp with feet apart>feet narrowed>feet together x 20 secs each.  Pt initially had difficulty with feet together but was able to correct within task therefore moved to feet together EC x 2 sets of 20 secs with good ankle and hip strategy noted.  Feet together EO horizontal and vertical head turns x 10 reps each with intermittent min/guard to steady, but overall did very well.  Transitioned to standing on mat placed on ground alternating cone taps x 7 reps (moving laterally) with min/guard A then tipping over and back upright to increase time spent in SLS as well as R LE strength x 7 reps.   Ended session with tall/half knee/quadruped on mat on floor for more targeted hip/core strengthening/NMR:  tall kneeling L hip abd x 10 reps (with minimal UE support), tall kneel to L half kneel x 10 reps (with minimal UE support), L half kneel moving weighted ball laterally x 10 reps each, quadruped fire hydrant x 10 reps on each side with cues throughout for improved stability in R hip to reduce lateral movements and improved core stabilization.  PT Short Term Goals - 09/10/20 1241      PT SHORT TERM GOAL #1   Title Pt will be independent with HEP for improved strength, balance, transfers, and gait.  TARGET 08/29/2020 (due to pt being out of town over the holiday)    Baseline 09/10/20: has a program, has not been compliant.    Status Partially Met      PT SHORT TERM GOAL #2   Title Pt will improve TUG score to less than or equal to 13 seconds for improved balance/decreased fall risk.    Baseline 09/10/20: 8.81 sec's no AD    Time --    Period --    Status Achieved      PT SHORT TERM GOAL #3   Title Sensory Organization test to be assessed, with goal to be written as appropriate.    Baseline 09/10/20: performed this date with PT to set goals    Time --    Period --    Status Achieved      PT SHORT TERM GOAL #4   Title  Pt will improve SLS each leg to 3 seconds for improved balance for dressing, stair negoitation.    Baseline 09/10/20: 3-5 sec's on each leg before needing UE support    Time --    Period --    Status Achieved      PT SHORT TERM GOAL #5   Title Pt will improve tandem stance to at least 30 seconds each foot position to demonstrate improved hip stability for balance.    Baseline 09/10/20: 25 sec's each foot position before needing UE support after a few tries. improved just not to goal level.    Time --    Period --    Status Partially Met      PT SHORT TERM GOAL #6   Title Pt will verbalize understanding of fall prevention in home environment.    Time 4    Period Weeks    Status On-going             PT Long Term Goals - 09/12/20 1429      PT LONG TERM GOAL #1   Title Pt will be independent with progression of HEP for improved strength, balance, transfers, and gait.  TARGET (extended) 10/10/2020, due to weeks remaining in POC    Time 6    Period Weeks    Status New      PT LONG TERM GOAL #2   Title Pt will improve gait velocity to at least 2.6 ft/sec for improved gait efficiency and safety.    Time 6    Period Weeks    Status New      PT LONG TERM GOAL #3   Title Pt will improve 5x sit<>stand to less than or equal to 9 seconds for improved functional strength and transfer efficiency.    Time 6    Period Weeks    Status New      PT LONG TERM GOAL #4   Title Pt will ambulate at least 500 ft modified independently with assistive device as appropriate and/or orthotic for improved safety and stability with gait.    Time 6    Period Weeks    Status New      PT LONG TERM GOAL #5   Title Pt will improve composite balance score on Sensory Organization Test to at least 50/100 for improved overall composite balance.    Baseline composite 40, vestibular 0; somatosensory and  visual decreased compared to normal    Time 4    Period Weeks    Status New                 Plan  - 10/02/20 1145    Clinical Impression Statement Skilled session focused on high level balance with emphasis on ankle/hip strategy along with ankle strengthening and NMR for R hip musculature along with core strengthening.  Pt tolerated well and is progressing towards LTGs.  PT did provide handout for ordering foot up brace and provided her with small heel wedge as well for RLE.    Examination-Activity Limitations Locomotion Level;Transfers;Stairs;Stand    Examination-Participation Restrictions Community Activity    Stability/Clinical Decision Making Stable/Uncomplicated    Rehab Potential Good    PT Frequency 2x / week    PT Duration 6 weeks   plus eval   PT Treatment/Interventions ADLs/Self Care Home Management;DME Instruction;Neuromuscular re-education;Balance training;Therapeutic exercise;Therapeutic activities;Functional mobility training;Stair training;Gait training;Patient/family education;Manual techniques;Energy conservation    PT Next Visit Plan work on sensory integration with vision removed, compliant surfaces and head turns. ? Bioness to bil LE's for strentening/muscle activation concurrent with ex's and gait. continue with use of foot up braces with small heel wedge as pt prefers these to [place] braces.   At some point-may try foot-up/AFO/Bioness for gait training   PT Home Exercise Plan Access Code: [place] and Agree with Plan of Care Patient           Patient will benefit from skilled therapeutic intervention in order to improve the following deficits and impairments:  Abnormal gait,Difficulty walking,Impaired tone,Decreased endurance,Decreased balance,Decreased mobility,Decreased strength  Visit Diagnosis: Unsteadiness on feet  Other abnormalities of gait and mobility  Muscle weakness (generalized)     Problem List There are no problems to display for this patient.   Cameron Sprang, PT, MPT [facility] [address] [place], [place], [address] Phone: [phone]   Fax:  [phone] 10/02/20, 11:48 AM  Name: Tabitha Andrews MRN: [id number] Date of Birth: 1966/04/30

## 2020-10-03 ENCOUNTER — Encounter: Payer: Self-pay | Admitting: Physical Therapy

## 2020-10-03 ENCOUNTER — Ambulatory Visit: Payer: BLUE CROSS/BLUE SHIELD | Admitting: Physical Therapy

## 2020-10-03 DIAGNOSIS — R2681 Unsteadiness on feet: Secondary | ICD-10-CM

## 2020-10-03 DIAGNOSIS — M6281 Muscle weakness (generalized): Secondary | ICD-10-CM

## 2020-10-03 NOTE — Therapy (Signed)
[facility] [address] [place], [place], [address] Phone: [phone]   Fax:  [phone]  Physical Therapy Treatment  Patient Details  Name: Tabitha Andrews MRN: [id number] Date of Birth: Jan 21, 1966 Referring Provider (PT): Erin Sons   Encounter Date: 10/03/2020   PT End of Session - 10/03/20 1423    Visit Number 9    Number of Visits 13    Authorization Type BCBS    PT Start Time 1233    PT Stop Time 5573    PT Time Calculation (min) 44 min    Equipment Utilized During Treatment --    Activity Tolerance Patient tolerated treatment well    Behavior During Therapy [facility] for tasks assessed/performed           History reviewed. No pertinent past medical history.  Past Surgical History:  Procedure Laterality Date  . CHOLECYSTECTOMY    . UTERINE FIBROID SURGERY N/A     There were no vitals filed for this visit.   Subjective Assessment - 10/03/20 1232    Subjective No changes, nothing new.  No falls in the past 2 weeks.    Patient Stated Goals I want to be able to walk as close as normal as possible.    Currently in Pain? No/denies                             [facility] Adult PT Treatment/Exercise - 10/03/20 0001      Neuro Re-ed    Neuro Re-ed Details  Ended session with tall/half knee/quadruped on mat on floor for more targeted hip/core strengthening/NMR:  tall kneeling<>sit back on heels x 10 reps, tall knee side step and then return to center, 10 reps, (with minimal UE support), tall kneel to L half kneel x 3 reps (with minimal UE support), to R half kneel x 3 reps.  Pt with difficulty and uses UE supprot.  Cues for maintaining upright trunk.  L and R half kneel moving weighted ball laterally x 10 reps each, then up and down 2 sets x 5; pt requiring several times to reposition for optimal stability.  Quadruped Ambulance person x 10 reps on each side, quadruped hip extension x 5 reps each  side, then hip extension with bent knee x 5 reps; with cues throughout for improved stability in R hip to reduce lateral movements and improved core stabilization. PT provides min guard>min assist at times.            Additional balance exercises reviewed as part of HEP:    Standing Near Stance in [place] with Eyes Closed - 1 x daily - 5 x weekly - 1 sets - 3 reps - 30 hold  -performed wide BOS, then narrow BOS 30 second hold without UE support>with light UE support for improved steadiness  -Performed narrow BOS EC with head motions up/down x 30 sec, side to side, 30 sec Wide Tandem Stance with Eyes Closed - 1 x daily - 5 x weekly - 1 sets - 3 reps - 30 hold  -Performed today with head turns and head nods, 30 sec each with UE support Wide Stance with Head Rotation on Foam Pad - 1 x daily - 5 x weekly - 1 sets - 10 reps  -on 2 pillows, performed x 10 reps head turns, 10 reps head nods  On 2 pillows in corner, heel/toe raises x 10 reps for ankle strategy, then wall  bumps anterior/posterior x 10 reps with UE support>progressing to no UE support x 10 reps for hip strategy work.     PT Education - 10/03/20 1422    Education Details Additions/progression of HEP.  Discussed POC and checking goals next week.  Pt states she can do a lot of this at home; PT would like to assess SOT progress to determine POC beyond next week.    Person(s) Educated Patient    Methods Explanation;Demonstration;Handout    Comprehension Verbalized understanding;Returned demonstration            PT Short Term Goals - 09/10/20 1241      PT SHORT TERM GOAL #1   Title Pt will be independent with HEP for improved strength, balance, transfers, and gait.  TARGET 08/29/2020 (due to pt being out of town over the holiday)    Baseline 09/10/20: has a program, has not been compliant.    Status Partially Met      PT SHORT TERM GOAL #2   Title Pt will improve TUG score to less than or equal to 13 seconds for improved  balance/decreased fall risk.    Baseline 09/10/20: 8.81 sec's no AD    Time --    Period --    Status Achieved      PT SHORT TERM GOAL #3   Title Sensory Organization test to be assessed, with goal to be written as appropriate.    Baseline 09/10/20: performed this date with PT to set goals    Time --    Period --    Status Achieved      PT SHORT TERM GOAL #4   Title Pt will improve SLS each leg to 3 seconds for improved balance for dressing, stair negoitation.    Baseline 09/10/20: 3-5 sec's on each leg before needing UE support    Time --    Period --    Status Achieved      PT SHORT TERM GOAL #5   Title Pt will improve tandem stance to at least 30 seconds each foot position to demonstrate improved hip stability for balance.    Baseline 09/10/20: 25 sec's each foot position before needing UE support after a few tries. improved just not to goal level.    Time --    Period --    Status Partially Met      PT SHORT TERM GOAL #6   Title Pt will verbalize understanding of fall prevention in home environment.    Time 4    Period Weeks    Status On-going             PT Long Term Goals - 09/12/20 1429      PT LONG TERM GOAL #1   Title Pt will be independent with progression of HEP for improved strength, balance, transfers, and gait.  TARGET (extended) 10/10/2020, due to weeks remaining in POC    Time 6    Period Weeks    Status New      PT LONG TERM GOAL #2   Title Pt will improve gait velocity to at least 2.6 ft/sec for improved gait efficiency and safety.    Time 6    Period Weeks    Status New      PT LONG TERM GOAL #3   Title Pt will improve 5x sit<>stand to less than or equal to 9 seconds for improved functional strength and transfer efficiency.    Time 6    Period Weeks  Status New      PT LONG TERM GOAL #4   Title Pt will ambulate at least 500 ft modified independently with assistive device as appropriate and/or orthotic for improved safety and stability with  gait.    Time 6    Period Weeks    Status New      PT LONG TERM GOAL #5   Title Pt will improve composite balance score on Sensory Organization Test to at least 50/100 for improved overall composite balance.    Baseline composite 40, vestibular 0; somatosensory and visual decreased compared to normal    Time 4    Period Weeks    Status New                 Plan - 10/03/20 1423    Clinical Impression Statement Skilled PT session today focused on additions to/progression of HEP for more hip stability and balance work at home.  She tolerates well, with cues given throughout for UE support as needed and trunk stability.    Examination-Activity Limitations Locomotion Level;Transfers;Stairs;Stand    Examination-Participation Restrictions Community Activity    Stability/Clinical Decision Making Stable/Uncomplicated    Rehab Potential Good    PT Frequency 2x / week    PT Duration 6 weeks   plus eval   PT Treatment/Interventions ADLs/Self Care Home Management;DME Instruction;Neuromuscular re-education;Balance training;Therapeutic exercise;Therapeutic activities;Functional mobility training;Stair training;Gait training;Patient/family education;Manual techniques;Energy conservation    PT Next Visit Plan Check Sensory Organization Test; based on progress-need to decide about continuing to work on balance strategies vs. compensation/education.  Check additions to HEP.  Discuss renew vs. d/c after next week.    PT Home Exercise Plan Access Code: [email or web address]    Consulted and Agree with Plan of Care Patient           Patient will benefit from skilled therapeutic intervention in order to improve the following deficits and impairments:  Abnormal gait,Difficulty walking,Impaired tone,Decreased endurance,Decreased balance,Decreased mobility,Decreased strength  Visit Diagnosis: Muscle weakness (generalized)  Unsteadiness on feet     Problem List There are no problems to display for this  patient.   Truitt Cruey W. 10/03/2020, 2:27 PM Frazier Butt., PT [facility] [address] [place], [place], [address] Phone: [phone]   Fax:  [phone]  Name: Tabitha Andrews MRN: [id number] Date of Birth: [birth year]

## 2020-10-03 NOTE — Patient Instructions (Addendum)
Access Code: [facility] URL: [email or web address] Date: 10/03/2020 Prepared by: Lonia Blood  Program Notes Do your HEP 3 days a week on days you do not come to therapy.  When you start at the gym: work on seated cardio only for now. As PT backs the ex's from your home program away we will add in machines, gym ex's for strengthening and balance. Be mindful not to over fatigue your body.   *Tall kneeling (on both knees)>1/2 kneel: Start in tall kneel position and bring one leg through forwards to bring foot on the ground, keeping your trunk as tall as possible.  Then return back to the start position. Repeat 2-3 times each leg.  Hold for support as needed.   Exercises Standing Near Stance in [place] with Eyes Closed - 1 x daily - 5 x weekly - 1 sets - 3 reps - 30 hold Wide Tandem Stance with Eyes Closed - 1 x daily - 5 x weekly - 1 sets - 3 reps - 30 hold Wide Stance with Head Rotation on Foam Pad - 1 x daily - 5 x weekly - 1 sets - 10 reps-Added progression in HEP to feet together and head motions Sit to Stand with Armchair - 1 x daily - 5 x weekly - 1 sets - 10 reps Side Stepping with Counter Support - 1 x daily - 5 x weekly - 1 sets - 3 reps Backward Walking with Counter Support - 1 x daily - 5 x weekly - 1 sets - 3 reps Quadruped Fire Hydrant - 1 x daily - 3 x weekly - 1 sets - 10 reps Quadruped Hip Extension Kicks - 1 x daily - 3 x weekly - 1 sets - 10 reps  Exercises in bold added to HEP 10/03/2020

## 2020-10-08 ENCOUNTER — Encounter: Payer: Self-pay | Admitting: Physical Therapy

## 2020-10-08 ENCOUNTER — Other Ambulatory Visit: Payer: Self-pay

## 2020-10-08 ENCOUNTER — Ambulatory Visit: Payer: BLUE CROSS/BLUE SHIELD | Admitting: Physical Therapy

## 2020-10-08 DIAGNOSIS — R2681 Unsteadiness on feet: Secondary | ICD-10-CM

## 2020-10-08 DIAGNOSIS — R2689 Other abnormalities of gait and mobility: Secondary | ICD-10-CM

## 2020-10-08 DIAGNOSIS — M6281 Muscle weakness (generalized): Secondary | ICD-10-CM

## 2020-10-09 NOTE — Therapy (Signed)
[facility] [address] [place], [place], [address] Phone: [phone]   Fax:  [phone]  Physical Therapy Treatment  Patient Details  Name: Tabitha Andrews MRN: [id number] Date of Birth: 1966/05/04 Referring Provider (PT): Erin Sons   Encounter Date: 10/08/2020   PT End of Session - 10/08/20 1233    Visit Number 10    Number of Visits 13    Authorization Type BCBS    PT Start Time 1230    PT Stop Time 1315    PT Time Calculation (min) 45 min    Activity Tolerance Patient tolerated treatment well    Behavior During Therapy [facility] for tasks assessed/performed           History reviewed. No pertinent past medical history.  Past Surgical History:  Procedure Laterality Date  . CHOLECYSTECTOMY    . UTERINE FIBROID SURGERY N/A     There were no vitals filed for this visit.   Subjective Assessment - 10/08/20 1232    Subjective No new complains. No falls or pain to report.    Patient Stated Goals I want to be able to walk as close as normal as possible.    Currently in Pain? No/denies    Pain Score 0-No pain                 [facility] Adult PT Treatment/Exercise - 10/08/20 1255      Transfers   Transfers Sit to Stand;Stand to Sit    Sit to Stand 6: Modified independent (Device/Increase time)    Five time sit to stand comments  10.37 sec's no UE support from standard height chair    Stand to Sit 6: Modified independent (Device/Increase time)      Ambulation/Gait   Ambulation/Gait Yes    Ambulation/Gait Assistance 4: Min guard;5: Supervision    Ambulation/Gait Assistance Details min guard assist for last ~100 feet of gait due to increased veering/instability as pt reported fatigue.    Ambulation Distance (Feet) 500 Feet   x1, plus around gym with session   Assistive device None    Gait Pattern Step-through pattern;Decreased arm swing - right;Decreased step length - right;Decreased stance time -  right;Decreased hip/knee flexion - right;Decreased dorsiflexion - right;Decreased weight shift to right;Right genu recurvatum;Decreased trunk rotation;Narrow base of support    Ambulation Surface Level;Unlevel;Indoor;Outdoor;Paved    Gait velocity 8.97 sec's= 3.66 ft/sec no AD               Balance Exercises - 10/08/20 1306      Balance Exercises: Standing   Standing Eyes Closed Narrow base of support (BOS);Wide (BOA);Head turns;Foam/compliant surface;Other reps (comment);30 secs;Limitations    Standing Eyes Closed Limitations on airex with no UE support: feet together for EC 30 sec's x 3 reps, progressing to feet apart for EC head movements left<>right, up<>down ~10 reps each. Min guard to min assist. Increased postural sway noted at times.           Neuro re-ed: sensory organization test performed with following results: Conditions: 1: all 3 below 2: all 3 below 3: all 3 below 4: 2 above, fall 5: 2 falls, one above 6: fall, above, fall Composite score: 47 Sensory Analysis Som: below Vis: below Vest: below, improved from 5% to ~20% Pref: above Strategy analysis: continues with ankle preference       COG alignment: less posterior with more central to anterior preference        PT Short Term Goals -  09/10/20 1241      PT SHORT TERM GOAL #1   Title Pt will be independent with HEP for improved strength, balance, transfers, and gait.  TARGET 08/29/2020 (due to pt being out of town over the holiday)    Baseline 09/10/20: has a program, has not been compliant.    Status Partially Met      PT SHORT TERM GOAL #2   Title Pt will improve TUG score to less than or equal to 13 seconds for improved balance/decreased fall risk.    Baseline 09/10/20: 8.81 sec's no AD    Time --    Period --    Status Achieved      PT SHORT TERM GOAL #3   Title Sensory Organization test to be assessed, with goal to be written as appropriate.    Baseline 09/10/20: performed this date with PT to set  goals    Time --    Period --    Status Achieved      PT SHORT TERM GOAL #4   Title Pt will improve SLS each leg to 3 seconds for improved balance for dressing, stair negoitation.    Baseline 09/10/20: 3-5 sec's on each leg before needing UE support    Time --    Period --    Status Achieved      PT SHORT TERM GOAL #5   Title Pt will improve tandem stance to at least 30 seconds each foot position to demonstrate improved hip stability for balance.    Baseline 09/10/20: 25 sec's each foot position before needing UE support after a few tries. improved just not to goal level.    Time --    Period --    Status Partially Met      PT SHORT TERM GOAL #6   Title Pt will verbalize understanding of fall prevention in home environment.    Time 4    Period Weeks    Status On-going             PT Long Term Goals - 10/08/20 1254      PT LONG TERM GOAL #1   Title Pt will be independent with progression of HEP for improved strength, balance, transfers, and gait.  TARGET (extended) 10/10/2020, due to weeks remaining in POC    Time 6    Period Weeks    Status On-going      PT LONG TERM GOAL #2   Title Pt will improve gait velocity to at least 2.6 ft/sec for improved gait efficiency and safety.    Baseline 10/08/20: 3.66 ft/sec with no AD    Time --    Period --    Status Achieved      PT LONG TERM GOAL #3   Title Pt will improve 5x sit<>stand to less than or equal to 9 seconds for improved functional strength and transfer efficiency.    Baseline 10/08/20: 10.37 sec's no UE support from standard height surface, improved just not to goal    Time --    Period --    Status Partially Met      PT LONG TERM GOAL #4   Title Pt will ambulate at least 500 ft modified independently with assistive device as appropriate and/or orthotic for improved safety and stability with gait.    Baseline 10/08/20: met the distance portion of goal with min guard to supervision assistance    Time --    Period --     Status  Partially Met      PT LONG TERM GOAL #5   Title Pt will improve composite balance score on Sensory Organization Test to at least 50/100 for improved overall composite balance.    Baseline 10/08/20: Composite score 47/100 (improved from 40/100), Vestibular ~20% (improved from 0, and Somatosensory and visual remain below norms).    Time --    Period --    Status Partially Met                 Plan - 10/08/20 1233    Clinical Impression Statement Today's skilled session focused on progress toward LTGs with goals partially to fully met. Pt feels she can continue to work on balance ex's at home at this time. Agreed to finalize HEP at next session and hold chart open for 2 weeks while pt works at home. If pt feels there is a decline or need to return she will call during that time.    Examination-Activity Limitations Locomotion Level;Transfers;Stairs;Stand    Examination-Participation Restrictions Community Activity    Stability/Clinical Decision Making Stable/Uncomplicated    Rehab Potential Good    PT Frequency 2x / week    PT Duration 6 weeks   plus eval   PT Treatment/Interventions ADLs/Self Care Home Management;DME Instruction;Neuromuscular re-education;Balance training;Therapeutic exercise;Therapeutic activities;Functional mobility training;Stair training;Gait training;Patient/family education;Manual techniques;Energy conservation    PT Next Visit Plan finalize HEP with plan to hold for 2 weeks to ensure pt is able to maintain while on her own.    PT Home Exercise Plan Access Code: [place]    Consulted and Agree with Plan of Care Patient           Patient will benefit from skilled therapeutic intervention in order to improve the following deficits and impairments:  Abnormal gait,Difficulty walking,Impaired tone,Decreased endurance,Decreased balance,Decreased mobility,Decreased strength  Visit Diagnosis: Muscle weakness (generalized)  Unsteadiness on feet  Other  abnormalities of gait and mobility     Problem List There are no problems to display for this patient.   Willow Ora, PTA, [facility] [address] [place], [place] [address] [phone] 10/09/20, 6:41 PM   Name: Dim Meisinger MRN: [id number] Date of Birth: 07/27/1966

## 2020-10-10 ENCOUNTER — Ambulatory Visit: Payer: BLUE CROSS/BLUE SHIELD | Admitting: Physical Therapy

## 2020-10-10 ENCOUNTER — Other Ambulatory Visit: Payer: Self-pay

## 2020-10-10 DIAGNOSIS — R2681 Unsteadiness on feet: Secondary | ICD-10-CM | POA: Diagnosis not present

## 2020-10-10 DIAGNOSIS — M6281 Muscle weakness (generalized): Secondary | ICD-10-CM

## 2020-10-10 NOTE — Therapy (Signed)
[facility] [address] [place], [place], [address] Phone: [phone]   Fax:  [phone]  Physical Therapy Treatment  Patient Details  Name: Tabitha Andrews MRN: [id number] Date of Birth: 18-May-1966 Referring Provider (PT): Erin Sons   Encounter Date: 10/10/2020   PT End of Session - 10/10/20 1322    Visit Number 11    Number of Visits 13    Authorization Type BCBS    PT Start Time 1233    PT Stop Time 1314    PT Time Calculation (min) 41 min    Activity Tolerance Patient tolerated treatment well    Behavior During Therapy [facility] for tasks assessed/performed           No past medical history on file.  Past Surgical History:  Procedure Laterality Date  . CHOLECYSTECTOMY    . UTERINE FIBROID SURGERY N/A     There were no vitals filed for this visit.   Subjective Assessment - 10/10/20 1237    Subjective Feel like I'm moving better, especially with walking.  Been working on my exercises, especially the tall kneel.    Patient Stated Goals I want to be able to walk as close as normal as possible.    Currently in Pain? No/denies                             [facility] Adult PT Treatment/Exercise - 10/10/20 0001      Knee/Hip Exercises: Aerobic   Stepper SciFit, Seated stepper, Level 1.2>1.8, 4 extremities, then trialed lower extremities only, x 3 minutes total.  Pt feels it's only using quads/hamstrings and wants it to work hips more.    Other Aerobic Discussed other aerobic machine options, including recumbent stepper or recumbent bike-pt to look into these when she goes back to [employer] Code: [facility] URL: [email or web address] Date: 10/10/2020 Prepared by: Mady Haagensen  Program Notes (Reviewed this and discussed aerobic machines) Do your HEP 3 days a week on days you do not come to therapy.  When you start at the gym: work on seated cardio only  for now. As PT backs the ex's from your home program away we will add in machines, gym ex's for strengthening and balance. Be mindful not to over fatigue your body.   *Tall kneeling (on both knees)>1/2 kneel: Start in tall kneel position and bring one leg through forwards to bring foot on the ground, keeping your trunk as tall as possible.  Then return back to the start position. Repeat 2-3 times each leg.  Hold for support as needed.    -Performed today, facing mat, 3-5 reps with BUE support, then 2 reps each side, no UE support (with LOB forward, but pt catches herself with hands).  Discussed progressing this by holding on as lightly as she needs to in order to maintain good balance.   Exercises Standing Near Stance in [place] with Eyes Closed - 1 x daily - 5 x weekly - 1 sets - 3 reps - 30 hold>progressed to EC head turns x 10, head nods x 10 (cues to hold support at chair in front) Wide Tandem Stance with Eyes Closed - 1 x daily - 5 x weekly - 1 sets - 3 reps - 30 hold Wide Stance with Head Rotation on Foam Pad - 1 x daily - 5 x weekly - 1 sets -  10 reps (eyes open, then worked on feet together, eyes open and head turns x 10, head nods x 10) Sit to Stand with Armchair - 1 x daily - 5 x weekly - 1 sets - 10 reps Side Stepping with Counter Support - 1 x daily - 5 x weekly - 1 sets - 3 reps (added red theraband for resistance, x 2 reps) Backward Walking with Counter Support - 1 x daily - 5 x weekly - 1 sets - 3 reps (forward/back walk)-pt reports getting easy, but kept in HEP to focus on heelstrike with short distance forward walk [employer] - 1 x daily - 3 x weekly - 1 sets - 10 reps (performed x 5 reps) Quadruped Hip Extension Kicks - 1 x daily - 3 x weekly - 1 sets - 10 reps (performed x 5 reps) Bird Dog - 1 x daily - 5 x weekly - 1 sets - 5 reps-added to HEP today-cues for abdominal activation and lower lift to maintain trunk stability         PT Education - 10/10/20 1319     Education Details Reviewed/revised HEP-see instructions/MedBridge notes    Person(s) Educated Patient    Methods Explanation;Demonstration;Handout    Comprehension Verbalized understanding;Returned demonstration            PT Short Term Goals - 09/10/20 1241      PT SHORT TERM GOAL #1   Title Pt will be independent with HEP for improved strength, balance, transfers, and gait.  TARGET 08/29/2020 (due to pt being out of town over the holiday)    Baseline 09/10/20: has a program, has not been compliant.    Status Partially Met      PT SHORT TERM GOAL #2   Title Pt will improve TUG score to less than or equal to 13 seconds for improved balance/decreased fall risk.    Baseline 09/10/20: 8.81 sec's no AD    Time --    Period --    Status Achieved      PT SHORT TERM GOAL #3   Title Sensory Organization test to be assessed, with goal to be written as appropriate.    Baseline 09/10/20: performed this date with PT to set goals    Time --    Period --    Status Achieved      PT SHORT TERM GOAL #4   Title Pt will improve SLS each leg to 3 seconds for improved balance for dressing, stair negoitation.    Baseline 09/10/20: 3-5 sec's on each leg before needing UE support    Time --    Period --    Status Achieved      PT SHORT TERM GOAL #5   Title Pt will improve tandem stance to at least 30 seconds each foot position to demonstrate improved hip stability for balance.    Baseline 09/10/20: 25 sec's each foot position before needing UE support after a few tries. improved just not to goal level.    Time --    Period --    Status Partially Met      PT SHORT TERM GOAL #6   Title Pt will verbalize understanding of fall prevention in home environment.    Time 4    Period Weeks    Status On-going             PT Long Term Goals - 10/10/20 1327      PT LONG TERM GOAL #1  Title Pt will be independent with progression of HEP for improved strength, balance, transfers, and gait.  TARGET  (extended) 10/10/2020, due to weeks remaining in POC    Time 6    Period Weeks    Status Achieved      PT LONG TERM GOAL #2   Title Pt will improve gait velocity to at least 2.6 ft/sec for improved gait efficiency and safety.    Baseline 10/08/20: 3.66 ft/sec with no AD    Status Achieved      PT LONG TERM GOAL #3   Title Pt will improve 5x sit<>stand to less than or equal to 9 seconds for improved functional strength and transfer efficiency.    Baseline 10/08/20: 10.37 sec's no UE support from standard height surface, improved just not to goal    Status Partially Met      PT LONG TERM GOAL #4   Title Pt will ambulate at least 500 ft modified independently with assistive device as appropriate and/or orthotic for improved safety and stability with gait.    Baseline 10/08/20: met the distance portion of goal with min guard to supervision assistance    Status Partially Met      PT LONG TERM GOAL #5   Title Pt will improve composite balance score on Sensory Organization Test to at least 50/100 for improved overall composite balance.    Baseline 10/08/20: Composite score 47/100 (improved from 40/100), Vestibular ~20% (improved from 0, and Somatosensory and visual remain below norms).    Status Partially Met                 Plan - 10/10/20 1323    Clinical Impression Statement LTG 1 met for HEP-pt demo understanding of HEP, and with HEP progressions given last visit, pt feels she is making progress.  Added quadruped bird dog exercise for trunk strengthening and resisted band for sidestepping to help with additional hip strength.  Pt feels she can take the exercises and work on them on her own as well as get back to the gym; she will hold on therapy for several weeks and call back if she needs additional therapy to upgrade exercises for balance/strength at that point.    Examination-Activity Limitations Locomotion Level;Transfers;Stairs;Stand    Examination-Participation Restrictions  Community Activity    Stability/Clinical Decision Making Stable/Uncomplicated    Rehab Potential Good    PT Frequency 2x / week    PT Duration 6 weeks   plus eval   PT Treatment/Interventions ADLs/Self Care Home Management;DME Instruction;Neuromuscular re-education;Balance training;Therapeutic exercise;Therapeutic activities;Functional mobility training;Stair training;Gait training;Patient/family education;Manual techniques;Energy conservation    PT Next Visit Plan Plan to hold for 2-3 weeks to ensure pt is able to maintain while on her own.  She will call back and let us know if she needs additional therapy sessions (recert will need to be done at that point)    PT Home Exercise Plan Access Code: [facility]    Consulted and Agree with Plan of Care Patient           Patient will benefit from skilled therapeutic intervention in order to improve the following deficits and impairments:  Abnormal gait,Difficulty walking,Impaired tone,Decreased endurance,Decreased balance,Decreased mobility,Decreased strength  Visit Diagnosis: Muscle weakness (generalized)  Unsteadiness on feet     Problem List There are no problems to display for this patient.   Aneshia Jacquet W. 10/10/2020, 1:28 PM  Frazier Butt., PT   [facility] [address] [place],  [place], [address] Phone: [phone]   Fax:  [phone]  Name: Tabitha Andrews MRN: [id number] Date of Birth: 07/07/1966

## 2020-10-10 NOTE — Patient Instructions (Signed)
Access Code: [facility] URL: [email or web address] Date: 10/10/2020 Prepared by: Lonia Blood  Program Notes Do your HEP 3 days a week on days you do not come to therapy.  When you start at the gym: work on seated cardio only for now. As PT backs the ex's from your home program away we will add in machines, gym ex's for strengthening and balance. Be mindful not to over fatigue your body.   *Tall kneeling (on both knees)>1/2 kneel: Start in tall kneel position and bring one leg through forwards to bring foot on the ground, keeping your trunk as tall as possible.  Then return back to the start position. Repeat 2-3 times each leg.  Hold for support as needed.  Exercises Standing Near Stance in [place] with Eyes Closed - 1 x daily - 5 x weekly - 1 sets - 3 reps - 30 hold Wide Tandem Stance with Eyes Closed - 1 x daily - 5 x weekly - 1 sets - 3 reps - 30 hold Wide Stance with Head Rotation on Foam Pad - 1 x daily - 5 x weekly - 1 sets - 10 reps Sit to Stand with Armchair - 1 x daily - 5 x weekly - 1 sets - 10 reps Side Stepping with Counter Support - 1 x daily - 5 x weekly - 1 sets - 3 reps-added red theraband resistance Backward Walking with Counter Support - 1 x daily - 5 x weekly - 1 sets - 3 reps Quadruped [employer] - 1 x daily - 3 x weekly - 1 sets - 10 reps Quadruped Hip Extension Kicks - 1 x daily - 3 x weekly - 1 sets - 10 reps Bird Dog - 1 x daily - 5 x weekly - 1 sets - 5 reps   Bold exercises added 10/10/20

## 2020-12-17 ENCOUNTER — Other Ambulatory Visit: Payer: Self-pay

## 2020-12-17 ENCOUNTER — Emergency Department
Admission: EM | Admit: 2020-12-17 | Discharge: 2020-12-17 | Disposition: A | Discharge status: Home / Self Care | Payer: BLUE CROSS/BLUE SHIELD | Attending: Emergency Medicine | Admitting: Emergency Medicine

## 2020-12-17 DIAGNOSIS — S01511A Laceration without foreign body of lip, initial encounter: Secondary | ICD-10-CM | POA: Insufficient documentation

## 2020-12-17 DIAGNOSIS — W19XXXA Unspecified fall, initial encounter: Secondary | ICD-10-CM

## 2020-12-17 DIAGNOSIS — W01198A Fall on same level from slipping, tripping and stumbling with subsequent striking against other object, initial encounter: Secondary | ICD-10-CM | POA: Insufficient documentation

## 2020-12-17 DIAGNOSIS — S0993XA Unspecified injury of face, initial encounter: Secondary | ICD-10-CM | POA: Diagnosis present

## 2020-12-17 MED ORDER — LIDOCAINE-EPINEPHRINE-TETRACAINE (LET) TOPICAL GEL
3.0000 mL | Freq: Once | TOPICAL | Status: AC
  Administered 2020-12-17: 3 mL via TOPICAL
  Filled 2020-12-17: qty 3

## 2020-12-17 NOTE — ED Notes (Signed)
Es[place] on paper

## 2020-12-17 NOTE — ED Provider Notes (Signed)
[facility] Emergency Department Provider Note ____________________________________________   Event Date/Time   First MD Initiated Contact with Patient 12/17/20 1828     (approximate)  I have reviewed the triage vital signs and the nursing notes.  HISTORY  Chief Complaint Fall   HPI Tabitha Andrews is a 55 y.o. femalewho presents to the ED for evaluation of lip laceration after fall  Chart review indicates no thinners. Patient with a history of MS at baseline, and a degree of instability with ambulation chronically due to this.  Patient reports being in the garage this afternoon, between a car and a wall, when she lost her balance and fell face first and landed with her head striking the concrete, and her biting her left lip causing injury.  Denies syncope.  She reports that she was able to get up and has ambulated since then and denies pain beyond her left lip.  Denies loose teeth or jaw/teeth malalignment.  Denies subsequent episodes of syncope or emesis.  Husband at the bedside provides of additional history and indicates that she is acting normally.  Patient reports a tetanus shot last year.   History reviewed. No pertinent past medical history.  There are no problems to display for this patient.   Past Surgical History:  Procedure Laterality Date  . CHOLECYSTECTOMY    . UTERINE FIBROID SURGERY N/A     Prior to Admission medications   Medication Sig Start Date End Date Taking? Authorizing Provider  Cholecalciferol 10000 units TABS Take by mouth.    [provider]  hydroquinone 4 % cream Apply topically 2 (two) times daily.    [provider]    Allergies Patient has no known allergies.  No family history on file.  Social History Social History   Tobacco Use  . Smoking status: Never Smoker  . Smokeless tobacco: Never Used  Substance Use Topics  . Alcohol use: No  . Drug use: No    Review of  Systems  Constitutional: No fever/chills Eyes: No visual changes. ENT: No sore throat. Cardiovascular: Denies chest pain. Respiratory: Denies shortness of breath. Gastrointestinal: No abdominal pain.  No nausea, no vomiting.  No diarrhea.  No constipation. Genitourinary: Negative for dysuria. Musculoskeletal: Negative for back pain. Skin: Negative for rash. Neurological: Negative for headaches, focal weakness or numbness  ____________________________________________   PHYSICAL EXAM:  VITAL SIGNS: Vitals:   12/17/20 1818  BP: 114/72  Pulse: 83  Resp: 16  Temp: 98.2 F (36.8 C)  SpO2: 100%      Constitutional: Alert and oriented. Well appearing and in no acute distress.  Holding an ice pack to her face and conversational in full sentences. Eyes: Conjunctivae are normal. PERRL. EOMI. Head: No midface instability, basilar skull fracture signs, periorbital step-offs or signs of external facial trauma. Nose: No congestion/rhinnorhea. Mouth/Throat: Mucous membranes are moist.  Oropharynx non-erythematous. Single L-shaped laceration to lateral aspect of upper lip to the left does not cross vermilion border.  Some gaping and will require repair  teeth are well aligned. Neck: No stridor. No cervical spine tenderness to palpation. Cardiovascular: Normal rate, regular rhythm. Grossly normal heart sounds.  Good peripheral circulation. Respiratory: Normal respiratory effort.  No retractions. Lungs CTAB. Gastrointestinal: Soft , nondistended, nontender to palpation. No CVA tenderness. Musculoskeletal: No lower extremity tenderness nor edema.  No joint effusions. No signs of acute trauma. Neurologic:  Normal speech and language. No gross focal neurologic deficits are appreciated.  Cranial nerves II through XII intact  5/5 strength and sensation in all 4 extremities Skin:  Skin is warm, dry and intact. No rash noted. Psychiatric: Mood and affect are normal. Speech and behavior are  normal.  ____________________________________________   PROCEDURES and INTERVENTIONS  Procedure(s) performed (including Critical Care):  Marland KitchenMarland KitchenLaceration Repair  Date/Time: 12/18/2020 12:45 AM Performed by: Delton Prairie, MD Authorized by: Delton Prairie, MD   Consent:    Consent obtained:  Verbal   Consent given by:  Patient and spouse   Risks discussed:  Infection, pain, need for additional repair, poor cosmetic result and poor wound healing Anesthesia:    Anesthesia method:  Topical application   Topical anesthetic:  LET Laceration details:    Location:  Lip   Lip location:  Upper interior lip   Length (cm):  1 Exploration:    Hemostasis achieved with:  Direct pressure   Contaminated: no   Skin repair:    Repair method:  Sutures   Suture size:  5-0   Wound skin closure material used: Monocryl.   Suture technique:  Simple interrupted   Number of sutures:  2 Approximation:    Approximation:  Close   Vermilion border well-aligned: yes   Repair type:    Repair type:  Simple Post-procedure details:    Dressing:  Open (no dressing)   Procedure completion:  Tolerated well, no immediate complications    Medications  lidocaine-EPINEPHrine-tetracaine (LET) topical gel (3 mLs Topical Given 12/17/20 1837)    ____________________________________________   MDM / ED COURSE   55 year old woman with history of MS presents to the ED with mechanical fall causing lip laceration, requiring repair and amenable to subsequent outpatient management.  Normal vitals.  Canadian CT head negative.  No neurologic or vascular deficits.  No signs of trauma beyond her lip laceration.  No intraoral trauma or signs of mandibular trauma.  No indication for imaging at this time.  We discussed repair and outpatient management and she is agreeable.   Clinical Course as of 12/18/20 0045  Wed Dec 17, 2020  1926 Lac repair complete. Well toelrated. x2 5-0 monocryl [DS]    Clinical Course User  Index [DS] Delton Prairie, MD    ____________________________________________   FINAL CLINICAL IMPRESSION(S) / ED DIAGNOSES  Final diagnoses:  Fall, initial encounter  Lip laceration, initial encounter     ED Discharge Orders    None       Tabitha Jorgenson Katrinka Blazing   Note:  This document was prepared using Dragon voice recognition software and may include unintentional dictation errors.   Delton Prairie, MD 12/18/20 [phone]

## 2020-12-17 NOTE — Discharge Instructions (Signed)
Use Tylenol for pain and fevers.  Up to 1000 mg per dose, up to 4 times per day.  Do not take more than 4000 mg of Tylenol/acetaminophen within 24 hours.. Use naproxen/Aleve for anti-inflammatory pain relief. Use up to 500mg  every 12 hours. Do not take more frequently than this. Do not use other NSAIDs (ibuprofen, Advil) while taking this medication. It is safe to take Tylenol with this.   Be careful eating sharp/hard foods and try not to use your tongue to poke at this area too much.  The 2 stitches they will dissolve on their own.  If the stitches break apart and you have a gaping laceration, fevers or pus coming from this area, please return to the ED.

## 2020-12-17 NOTE — ED Notes (Signed)
Patient unable to sign MSE-waiver due to inaccessibility to signature pad.

## 2020-12-17 NOTE — ED Triage Notes (Signed)
Pt to ER via POV with complaints of L sided mouth laceration. Reports becoming off balance, falling, and biting through the L side of her lip. Did hit head. Denies taking blood thinners. Reports having MS and balance issues.

## 2022-07-14 ENCOUNTER — Encounter: Payer: Self-pay | Admitting: Emergency Medicine

## 2022-07-14 ENCOUNTER — Emergency Department
Admission: EM | Admit: 2022-07-14 | Discharge: 2022-07-14 | Disposition: A | Discharge status: Home / Self Care | Payer: BC Managed Care – PPO | Attending: Student in an Organized Health Care Education/Training Program | Admitting: Student in an Organized Health Care Education/Training Program

## 2022-07-14 ENCOUNTER — Emergency Department: Payer: BC Managed Care – PPO

## 2022-07-14 DIAGNOSIS — W109XXA Fall (on) (from) unspecified stairs and steps, initial encounter: Secondary | ICD-10-CM | POA: Diagnosis not present

## 2022-07-14 DIAGNOSIS — M545 Low back pain, unspecified: Secondary | ICD-10-CM | POA: Diagnosis present

## 2022-07-14 DIAGNOSIS — R519 Headache, unspecified: Secondary | ICD-10-CM | POA: Insufficient documentation

## 2022-07-14 DIAGNOSIS — S32010A Wedge compression fracture of first lumbar vertebra, initial encounter for closed fracture: Secondary | ICD-10-CM

## 2022-07-14 HISTORY — DX: Relapsing-remitting multiple sclerosis: G35.A

## 2022-07-14 HISTORY — DX: Wedge compression fracture of first lumbar vertebra, initial encounter for closed fracture: S32.010A

## 2022-07-14 MED ORDER — OXYCODONE-ACETAMINOPHEN 5-325 MG PO TABS: 1.0000 | ORAL_TABLET | Freq: Four times a day (QID) | ORAL | 0 refills | Status: DC | PRN

## 2022-07-14 MED ORDER — OXYCODONE-ACETAMINOPHEN 5-325 MG PO TABS
1.0000 | ORAL_TABLET | Freq: Once | ORAL | Status: AC
  Administered 2022-07-14: 1 via ORAL
  Filled 2022-07-14: qty 1

## 2022-07-14 NOTE — ED Notes (Signed)
The pt ambulates at home with a walker at baseline secondary to MS, the pt has been given a walker here to see if she's able to ambulate at this time. The pt is able to ambulate with the walker, the pt states that she is having some discomfort in her back while ambulating, but feels comfortable with being discharged home. Charlsie Quest, PA has been made aware, will await further orders. Close monitoring continued.

## 2022-07-14 NOTE — ED Provider Notes (Signed)
[facility] Provider Note    Event Date/Time   First MD Initiated Contact with Patient 07/14/22 1920     (approximate)   History   Chief Complaint Fall   HPI Tabitha Andrews is a 56 y.o. female, history of multiple sclerosis (relapsing remitting), presents to the emergency department for evaluation of injuries from fall.  Patient states that she was trying to go upstairs and made about 5 steps up when she reportedly lost her footing and was holding several things at the time and could not grab onto the handrails, causing her to fall backwards and landed straight on her back.  She states that she did hit her head on the carpeted floor.  She is able to stand up briefly after the event, however noticed that she was having significant back pain.  Denies blood thinner usage.  Denies LOC.  She was brought in by [facility] and placed in a c-collar.  Denies any preceding symptoms.  Denies fever/chills, chest pain, shortness of breath, abdominal pain, flank pain, neck pain, nausea/vomiting, diarrhea, dysuria, vision change, hearing changes, weakness, numbness/tingling, or rash/lesions.  History Limitations: No limitations.        Physical Exam  Triage Vital Signs: ED Triage Vitals  Enc Vitals Group     BP 07/14/22 1917 117/72     Pulse Rate 07/14/22 1917 81     Resp 07/14/22 1917 18     Temp 07/14/22 1917 98.1 F (36.7 C)     Temp Source 07/14/22 1917 Oral     SpO2 07/14/22 1917 99 %     Weight 07/14/22 1915 130 lb (59 kg)     Height 07/14/22 1915 5' 4.5" (1.638 m)     Head Circumference --      Peak Flow --      Pain Score --      Pain Loc --      Pain Edu? --      Excl. in GC? --     Most recent vital signs: Vitals:   07/14/22 1917  BP: 117/72  Pulse: 81  Resp: 18  Temp: 98.1 F (36.7 C)  SpO2: 99%    General: Awake, NAD.  Skin: Warm, dry. No rashes or lesions.  Eyes: PERRL. Conjunctivae normal.  CV: Good peripheral perfusion.  Resp: Normal  effort.  Abd: Soft, non-tender. No distention.  Neuro: At baseline. No gross neurological deficits.  Musculoskeletal: Normal ROM of all extremities.  Focused Exam: No significant midline cervical spine tenderness.  Moderate midline spinal tenderness in the lumbar region.  Normal range of motion of all extremities.  PMS intact distally in all extremities.  She is still able to ambulate well on her own.  Physical Exam    ED Results / Procedures / Treatments  Labs (all labs ordered are listed, but only abnormal results are displayed) Labs Reviewed - No data to display   EKG N/A.    RADIOLOGY  ED Provider Interpretation: N/A.  CT Lumbar Spine Wo Contrast  Result Date: 07/14/2022 CLINICAL DATA:  Fall, pain in lower back EXAM: CT LUMBAR SPINE WITHOUT CONTRAST TECHNIQUE: Multidetector CT imaging of the lumbar spine was performed without intravenous contrast administration. Multiplanar CT image reconstructions were also generated. RADIATION DOSE REDUCTION: This exam was performed according to the departmental dose-optimization program which includes automated exposure control, adjustment of the mA and/or kV according to patient size and/or use of iterative reconstruction technique. COMPARISON:  None Available. FINDINGS: Segmentation: 5 lumbar  type vertebral bodies. Alignment: No listhesis.  Mild levocurvature. Vertebrae: Compression fracture at the superior endplate of L1, with approximately 20% vertebral body height loss anteriorly. The fracture does not involve the posterior cortex. No retropulsion. No evidence of fracture in the posterior elements of L1. Vertebral body heights are otherwise preserved. Endplate degenerative changes at L5-S1, eccentric to the right. Paraspinal and other soft tissues: Prior cholecystectomy. Aortic atherosclerosis. No lymphadenopathy. Disc levels: Mild degenerative changes, with small disc bulges at L4-L5 and L5-S1. Moderate facet arthropathy at L4-L5. No spinal  canal stenosis or significant neural foraminal narrowing. IMPRESSION: 1. Compression fracture at the superior endplate of L1, with approximately 20% vertebral body height loss anteriorly. No retropulsion. 2. Aortic atherosclerosis. Aortic Atherosclerosis (ICD10-I70.0). These results were called by telephone at the time of interpretation on 07/14/2022 at 8:47 pm to provider ROBINSON , who verbally acknowledged these results. Electronically Signed   By: Wiliam Ke M.D.   On: 07/14/2022 20:47   CT Cervical Spine Wo Contrast  Result Date: 07/14/2022 CLINICAL DATA:  Neck trauma with dangerous injury mechanism. Patient fell going up stairs. Pain. EXAM: CT CERVICAL SPINE WITHOUT CONTRAST TECHNIQUE: Multidetector CT imaging of the cervical spine was performed without intravenous contrast. Multiplanar CT image reconstructions were also generated. RADIATION DOSE REDUCTION: This exam was performed according to the departmental dose-optimization program which includes automated exposure control, adjustment of the mA and/or kV according to patient size and/or use of iterative reconstruction technique. COMPARISON:  MRI cervical spine 10/20/2016 FINDINGS: Alignment: Normal. Skull base and vertebrae: No acute fracture. No primary bone lesion or focal pathologic process. Soft tissues and spinal canal: No prevertebral fluid or swelling. No visible canal hematoma. Disc levels: Degenerative changes with disc space narrowing and endplate osteophyte formation most prominent at C4-5, C5-6, and C6-7 levels. Upper chest: Lung apices are clear. Other: None. IMPRESSION: Normal alignment. No acute displaced fractures identified. Moderate degenerative changes. Electronically Signed   By: Burman Nieves M.D.   On: 07/14/2022 20:18   CT Head Wo Contrast  Result Date: 07/14/2022 CLINICAL DATA:  Head trauma, moderate to severe. Patient fell going up stairs. EXAM: CT HEAD WITHOUT CONTRAST TECHNIQUE: Contiguous axial images were  obtained from the base of the skull through the vertex without intravenous contrast. RADIATION DOSE REDUCTION: This exam was performed according to the departmental dose-optimization program which includes automated exposure control, adjustment of the mA and/or kV according to patient size and/or use of iterative reconstruction technique. COMPARISON:  MRI brain 04/04/2018 FINDINGS: Brain: Diffuse cerebral atrophy. Ventricular dilatation consistent with central atrophy. Low-attenuation changes in the deep white matter consistent with small vessel ischemia. No abnormal extra-axial fluid collections. No mass effect or midline shift. Gray-white matter junctions are distinct. Basal cisterns are not effaced. No acute intracranial hemorrhage. Vascular: No hyperdense vessel or unexpected calcification. Skull: Normal. Negative for fracture or focal lesion. Sinuses/Orbits: No acute finding. Other: None. IMPRESSION: No acute intracranial abnormalities. Mild chronic atrophy and small vessel ischemic changes. Electronically Signed   By: Burman Nieves M.D.   On: 07/14/2022 20:15    PROCEDURES:  Critical Care performed: N/A.  Procedures    MEDICATIONS ORDERED IN ED: Medications  oxyCODONE-acetaminophen (PERCOCET/ROXICET) 5-325 MG per tablet 1 tablet (1 tablet Oral Given 07/14/22 2142)     IMPRESSION / MDM / ASSESSMENT AND PLAN / ED COURSE  I reviewed the triage vital signs and the nursing notes.  Differential diagnosis includes, but is not limited to, concussion, intracranial hemorrhage, cervical spine fracture, cervical sprain, lumbar spine fracture, lumbar strain.   Assessment/Plan Patient presents with injuries sustained from mechanical fall as she was going upstairs.  She denies any preceding symptoms.  On physical exam she does have some tenderness in her lumbar spine.  Head/neck CT did not show any acute findings fortunately, however her lumbar CT did show a compression  fracture along L1 with a loss of vertebral height of 20%.  Spoke with neurosurgery, who recommended conservative management and outpatient follow-up.  He states that she does not need a LSO brace at this time unless she wants one. Patient is still ambulatory at this time.  We will provide her with prescription for oxycodone/acetaminophen and have her follow-up with neurosurgery.  Patient was amenable to this plan.  Will discharge.  Provided the patient with anticipatory guidance, return precautions, and educational material. Encouraged the patient to return to the emergency department at any time if they begin to experience any new or worsening symptoms. Patient expressed understanding and agreed with the plan.   Patient's presentation is most consistent with acute complicated illness / injury requiring diagnostic workup.       FINAL CLINICAL IMPRESSION(S) / ED DIAGNOSES   Final diagnoses:  Compression fracture of L1 vertebra, initial encounter (HCC)     Rx / DC Orders   ED Discharge Orders          Ordered    oxyCODONE-acetaminophen (PERCOCET) 5-325 MG tablet  Every 6 hours PRN        07/14/22 2220             Note:  This document was prepared using Dragon voice recognition software and may include unintentional dictation errors.   Varney Daily, [place] 07/14/22 2220    Willy Eddy, MD 07/14/22 [phone]

## 2022-07-14 NOTE — ED Triage Notes (Signed)
Pt arrived via [facility] from home where pt reports losing her footing going upstairs (approx 5 steps up) and fell backwards landing on carpeted flooring. Pt c/o pain in lower back and head. Pt sts she hit her head but did not have LOC. Pt has hx/o MS. C-Collar in place.

## 2022-07-14 NOTE — Discharge Instructions (Addendum)
-  Please follow-up with the neurosurgeon listed in these instructions, as discussed.  You may also follow-up with your own neurosurgeon.  Of note you are seen here in the emergency department and you sustained a compression fracture of the L1 vertebrae with a loss of vertebral height of 20%.  -You may take oxycodone/acetaminophen as needed for pain, though use caution as it may make you dizzy/drowsy.  It can also be addicting.  -Return to the emergency department anytime if you begin to experience any new or worsening symptoms.

## 2022-07-14 NOTE — ED Notes (Signed)
First nurse note:  From home, [facility]. Pt fell backwards down 5 carpeted stairs. Hit posterior head, no LOC. No thinners. Complains of severe lower back pain. A&O times 4.   VS: 118/72, pulse 84 95% RA, 16.

## 2022-07-19 ENCOUNTER — Telehealth: Payer: Self-pay | Admitting: Neurosurgery

## 2022-07-19 NOTE — Telephone Encounter (Signed)
She declined appt , she is going to follow-up with her neurologist at [facility]. She is aware to call our office if she changes her mind and would like the appt.

## 2022-07-19 NOTE — Telephone Encounter (Signed)
-----   Message from Venetia Night, MD sent at 07/15/2022  7:28 AM EST ----- Follow up with any of [place] in 2-3 weeks

## 2022-10-11 ENCOUNTER — Other Ambulatory Visit: Payer: Self-pay | Admitting: Podiatry

## 2022-10-14 ENCOUNTER — Encounter: Payer: Self-pay | Admitting: Podiatry

## 2022-10-19 NOTE — Discharge Instructions (Signed)
[facility]  POST OPERATIVE INSTRUCTIONS FOR DR. FOWLER AND [facility]   Take your medication as prescribed.  Pain medication should be taken only as needed.  Keep the dressing clean, dry and intact.  Keep your foot elevated above the heart level for the first 48 hours.  Walking to the bathroom and brief periods of walking are acceptable, unless we have instructed you to be non-weight bearing.  Always wear your post-op shoe when walking.  Always use your crutches if you are to be non-weight bearing.  Do not take a shower. Baths are permissible as long as the foot is kept out of the water.   Every hour you are awake:  Bend your knee 15 times. Flex foot 15 times Massage calf 15 times  Call [facility] ([phone]) if any of the following problems occur: You develop a temperature or fever. The bandage becomes saturated with blood. Medication does not stop your pain. Injury of the foot occurs. Any symptoms of infection including redness, odor, or red streaks running from wound. 

## 2022-10-21 ENCOUNTER — Encounter
Admission: RE | Disposition: A | Discharge status: Home / Self Care | Payer: Self-pay | Source: Home / Self Care | Attending: Podiatry

## 2022-10-21 ENCOUNTER — Ambulatory Visit: Payer: BC Managed Care – PPO | Admitting: General Practice

## 2022-10-21 ENCOUNTER — Other Ambulatory Visit: Payer: Self-pay

## 2022-10-21 ENCOUNTER — Ambulatory Visit: Payer: Self-pay

## 2022-10-21 ENCOUNTER — Encounter: Payer: Self-pay | Admitting: Podiatry

## 2022-10-21 ENCOUNTER — Ambulatory Visit
Admission: RE | Admit: 2022-10-21 | Discharge: 2022-10-21 | Disposition: A | Discharge status: Home / Self Care | Payer: BC Managed Care – PPO | Attending: Podiatry | Admitting: Podiatry

## 2022-10-21 DIAGNOSIS — G35 Multiple sclerosis: Secondary | ICD-10-CM | POA: Diagnosis not present

## 2022-10-21 DIAGNOSIS — M2011 Hallux valgus (acquired), right foot: Secondary | ICD-10-CM | POA: Diagnosis not present

## 2022-10-21 DIAGNOSIS — M19071 Primary osteoarthritis, right ankle and foot: Secondary | ICD-10-CM | POA: Diagnosis not present

## 2022-10-21 DIAGNOSIS — M205X1 Other deformities of toe(s) (acquired), right foot: Secondary | ICD-10-CM | POA: Diagnosis not present

## 2022-10-21 HISTORY — PX: BUNIONECTOMY: SHX129

## 2022-10-21 SURGERY — BUNIONECTOMY
Anesthesia: General | Site: Toe | Laterality: Right

## 2022-10-21 MED ORDER — EPHEDRINE SULFATE (PRESSORS) 50 MG/ML IJ SOLN
INTRAMUSCULAR | Status: DC | PRN
  Administered 2022-10-21 (×4): 5 mg via INTRAVENOUS

## 2022-10-21 MED ORDER — ONDANSETRON HCL 4 MG/2ML IJ SOLN
INTRAMUSCULAR | Status: DC | PRN
  Administered 2022-10-21: 4 mg via INTRAVENOUS

## 2022-10-21 MED ORDER — OXYCODONE HCL 5 MG PO TABS: 5.0000 mg | ORAL_TABLET | Freq: Once | ORAL | Status: DC | PRN

## 2022-10-21 MED ORDER — CEFAZOLIN SODIUM-DEXTROSE 2-4 GM/100ML-% IV SOLN
2.0000 g | INTRAVENOUS | Status: AC
  Administered 2022-10-21: 2 g via INTRAVENOUS

## 2022-10-21 MED ORDER — ACETAMINOPHEN 500 MG PO TABS
1000.0000 mg | ORAL_TABLET | Freq: Once | ORAL | Status: AC
  Administered 2022-10-21: 1000 mg via ORAL

## 2022-10-21 MED ORDER — MIDAZOLAM HCL 5 MG/5ML IJ SOLN
INTRAMUSCULAR | Status: DC | PRN
  Administered 2022-10-21: 2 mg via INTRAVENOUS

## 2022-10-21 MED ORDER — 0.9 % SODIUM CHLORIDE (POUR BTL) OPTIME
TOPICAL | Status: DC | PRN
  Administered 2022-10-21: 500 mL

## 2022-10-21 MED ORDER — ONDANSETRON HCL 4 MG PO TABS: 4.0000 mg | ORAL_TABLET | Freq: Three times a day (TID) | ORAL | 0 refills | Status: AC | PRN

## 2022-10-21 MED ORDER — OXYCODONE HCL 5 MG/5ML PO SOLN: 5.0000 mg | Freq: Once | ORAL | Status: DC | PRN

## 2022-10-21 MED ORDER — LACTATED RINGERS IV SOLN: INTRAVENOUS | Status: DC

## 2022-10-21 MED ORDER — ONDANSETRON HCL 4 MG/2ML IJ SOLN: 4.0000 mg | Freq: Once | INTRAMUSCULAR | Status: DC | PRN

## 2022-10-21 MED ORDER — LIDOCAINE HCL (CARDIAC) PF 100 MG/5ML IV SOSY
PREFILLED_SYRINGE | INTRAVENOUS | Status: DC | PRN
  Administered 2022-10-21: 40 mg via INTRATRACHEAL

## 2022-10-21 MED ORDER — BUPIVACAINE HCL (PF) 0.25 % IJ SOLN: INTRAMUSCULAR | Status: DC | PRN

## 2022-10-21 MED ORDER — AMOXICILLIN-POT CLAVULANATE 875-125 MG PO TABS: 1.0000 | ORAL_TABLET | Freq: Two times a day (BID) | ORAL | 0 refills | Status: AC

## 2022-10-21 MED ORDER — OXYCODONE-ACETAMINOPHEN 5-325 MG PO TABS: 1.0000 | ORAL_TABLET | Freq: Four times a day (QID) | ORAL | 0 refills | Status: AC | PRN

## 2022-10-21 MED ORDER — PROPOFOL 10 MG/ML IV BOLUS
INTRAVENOUS | Status: DC | PRN
  Administered 2022-10-21: 120 mg via INTRAVENOUS

## 2022-10-21 MED ORDER — DEXMEDETOMIDINE HCL IN NACL 200 MCG/50ML IV SOLN
INTRAVENOUS | Status: DC | PRN
  Administered 2022-10-21 (×5): 4 ug via INTRAVENOUS

## 2022-10-21 MED ORDER — ASPIRIN 81 MG PO TBEC: 81.0000 mg | DELAYED_RELEASE_TABLET | Freq: Two times a day (BID) | ORAL | 0 refills | Status: AC

## 2022-10-21 MED ORDER — FENTANYL CITRATE PF 50 MCG/ML IJ SOSY: 25.0000 ug | PREFILLED_SYRINGE | INTRAMUSCULAR | Status: DC | PRN

## 2022-10-21 MED ORDER — ACETAMINOPHEN 160 MG/5ML PO SOLN: 960.0000 mg | Freq: Once | ORAL | Status: AC

## 2022-10-21 MED ORDER — BUPIVACAINE HCL (PF) 0.25 % IJ SOLN
INTRAMUSCULAR | Status: DC | PRN
  Administered 2022-10-21 (×2): 20 mL via PERINEURAL

## 2022-10-21 MED ORDER — DEXAMETHASONE SODIUM PHOSPHATE 4 MG/ML IJ SOLN
INTRAMUSCULAR | Status: DC | PRN
  Administered 2022-10-21 (×2): 2 mg via PERINEURAL

## 2022-10-21 SURGICAL SUPPLY — 50 items
APL SKNCLS STERI-STRIP NONHPOA (GAUZE/BANDAGES/DRESSINGS) ×1
BENZOIN TINCTURE PRP APPL 2/3 (GAUZE/BANDAGES/DRESSINGS) IMPLANT
BLADE OSC/SAGITTAL MD 5.5X18 (BLADE) IMPLANT
BLADE SAW LAPIPLASTY 40X11 (BLADE) IMPLANT
BLADE SURG 15 STRL LF DISP TIS (BLADE) IMPLANT
BLADE SURG 15 STRL SS (BLADE) ×2
BNDG CMPR 5X4 CHSV STRCH STRL (GAUZE/BANDAGES/DRESSINGS) ×1
BNDG CMPR STD VLCR NS LF 5.8X4 (GAUZE/BANDAGES/DRESSINGS) ×1
BNDG CMPR STD VLCR NS LF 5.8X6 (GAUZE/BANDAGES/DRESSINGS) ×1
BNDG COHESIVE 4X5 TAN STRL LF (GAUZE/BANDAGES/DRESSINGS) ×1 IMPLANT
BNDG ELASTIC 4X5.8 VLCR NS LF (GAUZE/BANDAGES/DRESSINGS) ×1 IMPLANT
BNDG ELASTIC 6X5.8 VLCR NS LF (GAUZE/BANDAGES/DRESSINGS) ×1 IMPLANT
BNDG GAUZE DERMACEA FLUFF 4 (GAUZE/BANDAGES/DRESSINGS) ×1 IMPLANT
BNDG GZE DERMACEA 4 6PLY (GAUZE/BANDAGES/DRESSINGS) ×1
CANISTER SUCT 1200ML W/VALVE (MISCELLANEOUS) ×1 IMPLANT
CLIP FIXATION STAPLE 10X10X10 (Staple) IMPLANT
COVER LIGHT HANDLE UNIVERSAL (MISCELLANEOUS) ×2 IMPLANT
CUFF TOURN SGL QUICK 18X4 (TOURNIQUET CUFF) IMPLANT
DRAPE FLUOR MINI C-ARM 54X84 (DRAPES) ×1 IMPLANT
DURAPREP 26ML APPLICATOR (WOUND CARE) ×1 IMPLANT
ELECT REM PT RETURN 9FT ADLT (ELECTROSURGICAL) ×1
ELECTRODE REM PT RTRN 9FT ADLT (ELECTROSURGICAL) ×1 IMPLANT
GAUZE SPONGE 4X4 12PLY STRL (GAUZE/BANDAGES/DRESSINGS) ×1 IMPLANT
GAUZE XEROFORM 1X8 LF (GAUZE/BANDAGES/DRESSINGS) ×1 IMPLANT
GLOVE BIOGEL PI IND STRL 7.5 (GLOVE) ×1 IMPLANT
GLOVE SURG SS PI 7.0 STRL IVOR (GLOVE) ×1 IMPLANT
GOWN STRL REUS W/ TWL LRG LVL3 (GOWN DISPOSABLE) ×2 IMPLANT
GOWN STRL REUS W/TWL LRG LVL3 (GOWN DISPOSABLE) ×2
GRAFT DMB PUTTY 1 OPTIUM FD (Bone Implant) IMPLANT
KIT PROCEDURE DRILL (DRILL) IMPLANT
KIT TURNOVER KIT A (KITS) ×1 IMPLANT
LAPIPLASTY SYS 4A (Orthopedic Implant) ×1 IMPLANT
NS IRRIG 500ML POUR BTL (IV SOLUTION) ×1 IMPLANT
PACK EXTREMITY ARMC (MISCELLANEOUS) ×1 IMPLANT
PADDING CAST BLEND 4X4 NS (MISCELLANEOUS) ×3 IMPLANT
PUTTY DBM OPTIUM 1CC (Bone Implant) ×1 IMPLANT
SCREW 2.7 HIGH PITCH LOCKING (Screw) IMPLANT
SCREW HIGH PITCH LOCK 2.7 (Screw) IMPLANT
SPLINT CAST 1 STEP 4X30 (MISCELLANEOUS) ×1 IMPLANT
STOCKINETTE IMPERVIOUS LG (DRAPES) ×1 IMPLANT
STRIP CLOSURE SKIN 1/4X4 (GAUZE/BANDAGES/DRESSINGS) IMPLANT
SUT ETHILON 3-0 (SUTURE) IMPLANT
SUT MNCRL 4-0 (SUTURE) ×1
SUT MNCRL 4-0 27XMFL (SUTURE) ×1
SUT VIC AB 3-0 SH 27 (SUTURE) ×1
SUT VIC AB 3-0 SH 27X BRD (SUTURE) IMPLANT
SUT VIC AB 4-0 SH 27 (SUTURE) ×1
SUT VIC AB 4-0 SH 27XANBCTRL (SUTURE) IMPLANT
SUTURE MNCRL 4-0 27XMF (SUTURE) IMPLANT
SYSTEM LAPIPLASTY 4A (Orthopedic Implant) IMPLANT

## 2022-10-21 NOTE — Anesthesia Preprocedure Evaluation (Signed)
Anesthesia Evaluation  Patient identified by MRN, date of birth, ID band Patient awake    Reviewed: Allergy & Precautions, NPO status , Patient's Chart, lab work & pertinent test results  History of Anesthesia Complications Negative for: history of anesthetic complications  Airway Mallampati: II  TM Distance: >3 FB Neck ROM: Full    Dental no notable dental hx. (+) Teeth Intact   Pulmonary neg pulmonary ROS, neg sleep apnea, neg COPD, Patient abstained from smoking.Not current smoker   Pulmonary exam normal breath sounds clear to auscultation       Cardiovascular Exercise Tolerance: Good METS(-) hypertension(-) CAD and (-) Past MI negative cardio ROS (-) dysrhythmias  Rhythm:Regular Rate:Normal - Systolic murmurs    Neuro/Psych negative neurological ROS  negative psych ROS   GI/Hepatic ,neg GERD  ,,(+)     (-) substance abuse    Endo/Other  neg diabetes    Renal/GU negative Renal ROS     Musculoskeletal   Abdominal   Peds  Hematology   Anesthesia Other Findings Past Medical History: 07/14/2022: Compression fracture of L1 lumbar vertebra (HCC) No date: Multiple sclerosis, relapsing-remitting (HCC)  Reproductive/Obstetrics                             Anesthesia Physical Anesthesia Plan  ASA: 2  Anesthesia Plan: General   Post-op Pain Management: Tylenol PO (pre-op), Regional block and Fentanyl IV   Induction: Intravenous  PONV Risk Score and Plan: 4 or greater and Ondansetron, Dexamethasone and Midazolam  Airway Management Planned: LMA  Additional Equipment: None  Intra-op Plan:   Post-operative Plan: Extubation in OR  Informed Consent: I have reviewed the patients History and Physical, chart, labs and discussed the procedure including the risks, benefits and alternatives for the proposed anesthesia with the patient or authorized representative who has indicated his/her  understanding and acceptance.     Dental advisory given  Plan Discussed with: CRNA and Surgeon  Anesthesia Plan Comments: (Discussed risks of anesthesia with patient, including PONV, sore throat, lip/dental/eye damage. Rare risks discussed as well, such as cardiorespiratory and neurological sequelae, and allergic reactions. Discussed the role of CRNA in patient's perioperative care. Patient understands.  Discussed r/b/a of adductor canal and popliteal nerve block, including:  - bleeding, infection, nerve damage - poor or non functioning block. - reactions and toxicity to local anesthetic Patient understands. )       Anesthesia Quick Evaluation

## 2022-10-21 NOTE — Anesthesia Procedure Notes (Signed)
Anesthesia Regional Block: Adductor canal block   Pre-Anesthetic Checklist: , timeout performed,  Correct Patient, Correct Site, Correct Laterality,  Correct Procedure, Correct Position, site marked,  Risks and benefits discussed,  Surgical consent,  Pre-op evaluation,  At surgeon's request and post-op pain management  Laterality: Lower and Right  Prep: chloraprep       Needles:  Injection technique: Single-shot  Needle Type: Echogenic Needle     Needle Length: 9cm  Needle Gauge: 21     Additional Needles:   Procedures:,,,, ultrasound used (permanent image in chart),,    Narrative:  Injection made incrementally with aspirations every 5 mL.  Performed by: Personally  Anesthesiologist: Arita Miss, MD  Additional Notes: Patient's chart reviewed and they were deemed appropriate candidate for procedure, per surgeon's request. Patient educated about risks, benefits, and alternatives of the block including but not limited to: temporary or permanent nerve damage, bleeding, infection, damage to surround tissues, block failure, local anesthetic toxicity. Patient expressed understanding. A formal time-out was conducted consistent with institution rules.  Monitors were applied, and minimal sedation used (see nursing record). The site was prepped with skin prep and allowed to dry, and sterile gloves were used. A high frequency linear ultrasound probe with probe cover was utilized throughout. Femoral artery visualized at mid-thigh level, local anesthetic injected anterolateral to it, and echogenic block needle trajectory was monitored throughout. Hydrodissection of saphenous nerve visualized and appeared anatomically normal. Aspiration performed every 81m. Blood vessels were avoided. All injections were performed without resistance and free of blood and paresthesias. The patient tolerated the procedure well.  Injectate: 262m0.25% bupivacaine + '2mg'$  decadron

## 2022-10-21 NOTE — Anesthesia Postprocedure Evaluation (Signed)
Anesthesia Post Note  Patient: Tabitha Andrews  Procedure(s) Performed: BUNIONECTOMY LAPIDUS TYPE + AKIN (Right: Toe)  Patient location during evaluation: PACU Anesthesia Type: General Level of consciousness: awake and alert Pain management: pain level controlled Vital Signs Assessment: post-procedure vital signs reviewed and stable Respiratory status: spontaneous breathing, nonlabored ventilation, respiratory function stable and patient connected to nasal cannula oxygen Cardiovascular status: blood pressure returned to baseline and stable Postop Assessment: no apparent nausea or vomiting Anesthetic complications: no   No notable events documented.   Last Vitals:  Vitals:   10/21/22 0725 10/21/22 1002  BP: 131/70 122/76  Pulse: 85 91  Resp: 17 20  Temp:  (!) 36.2 C  SpO2: 100% 100%    Last Pain:  Vitals:   10/21/22 1002  TempSrc:   PainSc: 0-No pain                 Arita Miss

## 2022-10-21 NOTE — Transfer of Care (Signed)
Immediate Anesthesia Transfer of Care Note  Patient: Tabitha Andrews  Procedure(s) Performed: Lillard Anes LAPIDUS TYPE + AKIN (Right: Toe)  Patient Location: PACU  Anesthesia Type: No value filed.  Level of Consciousness: awake, alert  and patient cooperative  Airway and Oxygen Therapy: Patient Spontanous Breathing and Patient connected to supplemental oxygen  Post-op Assessment: Post-op Vital signs reviewed, Patient's Cardiovascular Status Stable, Respiratory Function Stable, Patent Airway and No signs of Nausea or vomiting  Post-op Vital Signs: Reviewed and stable  Complications: No notable events documented.

## 2022-10-21 NOTE — Addendum Note (Signed)
Addendum  created 10/21/22 1200 by Arita Miss, MD   Intraprocedure Staff edited

## 2022-10-21 NOTE — Op Note (Signed)
PODIATRY / FOOT AND ANKLE SURGERY OPERATIVE REPORT    SURGEON: Caroline More, DPM  PRE-OPERATIVE DIAGNOSIS:  1.  Right hallux valgus with hypermobile first ray  POST-OPERATIVE DIAGNOSIS: Same  PROCEDURE(S): Right Lapidus bunionectomy with Akin osteotomy  HEMOSTASIS: Right ankle tourniquet  ANESTHESIA: MAC  ESTIMATED BLOOD LOSS: 20 cc  FINDING(S): 1.  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 female who presents with a painful bunion deformity to the first metatarsal phalangeal joint both at the medial eminence and the dorsal aspect of the joint.  Patient did not appear to have any limitation of range of motion to the area but appeared to have a hypermobile first ray at the first tarsometatarsal joint.  X-ray imaging was taken and reviewed with patient.  Patient appeared to have a moderate to severe bunion deformity with hypermobility of the first tarsometatarsal joint and increased first intermetatarsal space angle as well as hallux abductus angle and hallux interphalangeus angles.  All treatment options were discussed with patient both conservative and surgical attempts at correction include potential risks and complications of surgical intervention.  Patient believes that she has exhausted conservative measures and would like to proceed of surgery at this time consisting of right Lapidus bunionectomy with possible Akin osteotomy.  No guarantees given.  Consent obtained prior to procedure.  DESCRIPTION: After obtaining full informed written consent, the patient was brought back to the operating room and placed supine upon the operating table.  A popliteal/saphenous nerve block was performed by anesthesia preoperatively.  The patient received IV antibiotics prior to induction.  After obtaining adequate anesthesia, the patient was prepped and draped in the standard  fashion.  An Esmarch bandage was used to exsanguinate the right lower extremity and pneumatic ankle tourniquet was inflated.  An Esmarch bandage was used to exsanguinate the right lower extremity and the pneumatic ankle tourniquet was inflated.  Attention was then directed to the dorsal aspect of the first tarsometatarsal joint where a linear longitudinal incision was made slightly medial to the extensor hallucis longus tendon.  The incision was deepened through the subcutaneous tissues utilizing sharp and blunt dissection care was taken to identify retract all vital neurovascular structures and all venous contributories were cauterized as necessary.  At this time a capsular and periosteal incision was made medial to the tendon of the extensor hallucis longus.  The capsular and periosteal tissues were reflected medially and laterally at the operative site thereby exposing the first tarsometatarsal joint.  An osteotome was placed into the joint to free up any capsular or ligamentous adhesions.  The sagittal bone saw was then placed into the joint as well to make the joint and to a smooth contour passing through the joint both dorsally and plantarly.  Attention was then directed to the first metatarsal phalange joint dorsal medially where a linear longitudinal incision was made medial to the tendon of the extensor hallucis longus involve the contour deformity.  The incision was deepened through the subcutaneous tissues utilizing sharp and blunt dissection care was taken to identify and retract all vital neurovascular structures and all venous contributories were cauterized as necessary.  At this time a capsular incision was made and periosteal incision was made medial to the tendon of the extensor hallucis longus following the contour of the incision.  The Periosteal Tissue Was Reflected Medially and Laterally at the Operative Site Thereby Exposing the First Metatarsal  Phalange Joint at the Operative Site.  There  Appeared to Be Cartilage Damage to the Medial Aspect of the Joint As Well As Dorsal Aspect of the Joint but Centrally and Laterally Appeared to Be Intact with No Cartilaginous Damage.  The medial eminence was resected and passed off the operative site as well as the dorsal exostosis.  This encompassed most of the cartilage damage present.  After this was performed the joint appeared to have free range of motion overall did not appear to have any limitation, no residual cartilage damage appeared present after resection.  Attention was directed to the first interspace via the same incision where the extensor tendon and capsular tissues were retracted medially and the skin and subcutaneous tissue was retracted laterally.  At this time a lateral release was performed releasing the collateral and suspensory ligaments as well as the conjoined tendon of the adductor hallucis.  This appeared to complete the lateral release as the hallux appeared to be more mobile in a rectus position.  Attention was then directed back to the first tarsometatarsal joint procedure area.  The fulcrum was placed in between the first and second metatarsal bases.  At this time the rotational wire was then placed at the first metatarsal base and the first metatarsal was brought through rotation noted to be freely and was held in a rectus position overall.  At this time a small percutaneous incision was made over the dorsal lateral aspect of the second metatarsal midshaft.  Blunt dissection was continued down to the bone with a hemostat.  At this time the reduction aid from [place] was placed.  The reduction aid was then tightened while holding the rotational wire into the appropriate position reducing the rotation of the first metatarsal and reducing the intermetatarsal space angle at the same time.  This was done under fluoroscopic guidance.  Excellent reduction was able to be obtained.  The hallux appeared to be in a much more rectus  position, sesamoids appear to be underneath the first metatarsal head, and the first intermetatarsal space angle appeared to be well reduced.  The joint appeared to be well aligned in the sagittal plane as well.  The temporary fixation was then obtained from 1st-2nd metatarsal using the guide.  The cut guide was then applied and fixated into place under fluoroscopic guidance and noted to have excellent placement.  At this time to cuts were then made, 1 into the proximal base of the first metatarsal and 1 to the distal aspect of the medial cuneiform removing the articular cartilage to the area.  The cut guide was then removed along with one of the temporary wires, 2 wires were left in place for the distractor compressor device.  The distractor was then applied.  Any residual articular cartilage was resected and passed off the operative site.  The surgical site was flushed with copious amounts normal sterile saline.  The joint was then prepped further with fenestration and fish scaling.  The compressor was then applied while holding the toe in a rectus position.  Excellent apposition was noted to the first tarsometatarsal joint.  The first ray appeared to be in a rectus position overall.  Temporary fixation was obtained with a threaded olive wire across the first tarsometatarsal joint.  Attention was directed to the medial aspect of the first tarsometatarsal joint where a 4 hole [employer] locking plate was applied.  Temporary fixation was obtained with olive wires.  The 2 central screws were then placed  utilizing standard AO principles and techniques, the olive wires were then removed and the 2 ends screws were then placed.  This screws appeared to fit appropriately into the plate and appeared to be the appropriate length and size under fluoroscopic guidance.  The joint compressor was then removed along with the wires but the temporary wire for fixation was still left intact.  At this time a dorsal plate was then  applied in a similar manner with 4 locking screws utilizing standard AO principles and techniques from [employer].  Excellent fixation was noted with excellent compression and apposition of the first tarsometatarsal joint.  The threaded olive wire was then removed after that time.  The hallux was then assessed afterwards and still appeared to have a slight increase in the hallux interphalangeus angle so at this time it was determined to perform an Aiken osteotomy.  Dissection was continued distally at the first metatarsal phalangeal joint.  The capsular and periosteal tissues were then elevated off of the proximal phalanx base and midshaft medially and laterally thereby exposing the base midshaft of the proximal phalanx of the hallux.  A guidewire was then placed through the dorsal aspect of the proximal phalanx midshaft laterally as a cut guide.  At this time the sagittal bone saw was used to remove a 3 mm bone wedge from the area.  The bone wedge was resected and passed off the operative site.  The fragment appeared to be mobilized but the lateral hinge was left intact.  The toe was then held in a rectus position and a Paragon 28 10 mm staple was placed across the osteotomy area and excellent apposition was noted of the fragment with excellent compression.  The surgical sites were all flushed with copious amounts normal sterile saline.  The periosteal and capsular structures at both incision sites were reapproximated well coapted with 3-0 Vicryl.  The subcutaneous tissue was then reapproximated well coapted with 4-0 Vicryl.  The skin was then reapproximated well coapted with 4-0 Monocryl in a running subcuticular stitch.  The dorsal second metatarsal incision was reapproximated well coapted with 4-0 nylon.  Benzoin and Steri-Strips were then applied.  Final C-arm imaging was then taken showing reduction of the first intermetatarsal space angle as well as rotation of the first metatarsal head showed since  the sesamoids were underneath the first metatarsal head, the hallux also appeared in rectus position with reduction in the hallux interphalangeus angle and hallux abductus angles to within normal limits.  Excellent compression was noted across the first tarsometatarsal joint and proximal phalanx osteotomy with screws and staples in place to the appropriate size and length.  The pneumatic ankle tourniquet was deflated and a prompt hyperemic response was noted all digits of the right foot.  A postoperative dressing was applied consisting of Xeroform to the incision lines, 4 x 4 gauze, Kerlix, Webril, posterior splint, Ace wrap.  The patient tolerated the procedure and anesthesia well was transferred to recovery room vital signs stable vascular status intact all toes the right foot.  Following a period of postoperative monitoring the patient be discharged home with the appropriate orders, medications, and instructions.  Patient is to remain nonweightbearing at all times for the next 4 weeks.  Discussed with family in detail.  Patient to follow-up in 1 week in outpatient clinic.   COMPLICATIONS: None  CONDITION: Good, stable  Caroline More, DPM

## 2022-10-21 NOTE — H&P (Signed)
HISTORY AND PHYSICAL INTERVAL NOTE:  10/21/2022  7:18 AM  Tabitha Andrews  has presented today for surgery, with the diagnosis of M20.11 - Hallux valgus, right.  The various methods of treatment have been discussed with the patient.  No guarantees were given.  After consideration of risks, benefits and other options for treatment, the patient has consented to surgery.  I have reviewed the patients' chart and labs.  PROCEDURE: RIGHT LAPIDUS BUNIONECTOMY WITH POSSIBLE AKIN OSTEOTOMY     A history and physical examination was performed in my office.  The patient was reexamined.  There have been no changes to this history and physical examination.  Caroline More, DPM

## 2022-10-21 NOTE — Anesthesia Procedure Notes (Signed)
Procedure Name: LMA Insertion Date/Time: 10/21/2022 7:41 AM  Performed by: Moises Blood, CRNAPre-anesthesia Checklist: Patient identified, Emergency Drugs available, Suction available, Patient being monitored and Timeout performed Patient Re-evaluated:Patient Re-evaluated prior to induction Oxygen Delivery Method: Circle system utilized Preoxygenation: Pre-oxygenation with 100% oxygen Induction Type: IV induction LMA: LMA inserted LMA Size: 4.0 Number of attempts: 1 Dental Injury: Teeth and Oropharynx as per pre-operative assessment

## 2022-10-21 NOTE — Progress Notes (Signed)
Assisted Zak ANMD with right, popliteal/saphenous, ultrasound guided block. Side rails up, monitors on throughout procedure. See vital signs in flow sheet. Tolerated Procedure well.

## 2022-10-21 NOTE — Anesthesia Procedure Notes (Signed)
Anesthesia Regional Block: Popliteal block   Pre-Anesthetic Checklist: , timeout performed,  Correct Patient, Correct Site, Correct Laterality,  Correct Procedure, Correct Position, site marked,  Risks and benefits discussed,  Surgical consent,  Pre-op evaluation,  At surgeon's request and post-op pain management  Laterality: Lower and Right  Prep: chloraprep       Needles:  Injection technique: Single-shot  Needle Type: Echogenic Needle     Needle Length: 9cm  Needle Gauge: 21     Additional Needles:   Procedures:,,,, ultrasound used (permanent image in chart),,    Narrative:  Injection made incrementally with aspirations every 5 mL.  Performed by: Personally  Anesthesiologist: Arita Miss, MD  Additional Notes: Patient's chart reviewed and they were deemed appropriate candidate for procedure, at surgeon's request. Patient educated about risks, benefits, and alternatives of the block including but not limited to: temporary or permanent nerve damage, bleeding, infection, damage to surround tissues, block failure, local anesthetic toxicity. Patient expressed understanding. A formal time-out was conducted consistent with institution rules.  Monitors were applied, and minimal sedation used. The site was prepped with skin prep and allowed to dry, and sterile gloves were used. A high frequency linear ultrasound probe with probe cover was utilized throughout. Popliteal artery pulsatile and visualized in popliteal fossa along with adjacent sciatic nerve and its branch point, which appeared anatomically normal, local anesthetic injected around them just proximal to the branch point, and echogenic block needle trajectory was monitored throughout. Aspiration performed every 46m. Blood vessels were avoided. All injections were performed without resistance and free of blood and paresthesias. The patient tolerated the procedure well.  Injectate: 28m0.25% bupivacaine + '2mg'$  decadron

## 2022-10-22 ENCOUNTER — Encounter: Payer: Self-pay | Admitting: Podiatry

## 2022-11-08 ENCOUNTER — Encounter: Payer: Self-pay | Admitting: Podiatry

## 2023-07-11 ENCOUNTER — Emergency Department: Payer: BC Managed Care – PPO

## 2023-07-11 ENCOUNTER — Other Ambulatory Visit: Payer: Self-pay

## 2023-07-11 ENCOUNTER — Emergency Department
Admission: EM | Admit: 2023-07-11 | Discharge: 2023-07-11 | Disposition: A | Discharge status: Home / Self Care | Payer: BC Managed Care – PPO | Attending: Emergency Medicine | Admitting: Emergency Medicine

## 2023-07-11 DIAGNOSIS — W19XXXA Unspecified fall, initial encounter: Secondary | ICD-10-CM

## 2023-07-11 DIAGNOSIS — S2231XA Fracture of one rib, right side, initial encounter for closed fracture: Secondary | ICD-10-CM | POA: Diagnosis not present

## 2023-07-11 DIAGNOSIS — S299XXA Unspecified injury of thorax, initial encounter: Secondary | ICD-10-CM | POA: Diagnosis present

## 2023-07-11 DIAGNOSIS — W108XXA Fall (on) (from) other stairs and steps, initial encounter: Secondary | ICD-10-CM | POA: Diagnosis not present

## 2023-07-11 DIAGNOSIS — Y92009 Unspecified place in unspecified non-institutional (private) residence as the place of occurrence of the external cause: Secondary | ICD-10-CM | POA: Insufficient documentation

## 2023-07-11 DIAGNOSIS — S0990XA Unspecified injury of head, initial encounter: Secondary | ICD-10-CM | POA: Diagnosis not present

## 2023-07-11 MED ORDER — CYCLOBENZAPRINE HCL 10 MG PO TABS
5.0000 mg | ORAL_TABLET | Freq: Once | ORAL | Status: AC
  Administered 2023-07-11: 5 mg via ORAL
  Filled 2023-07-11: qty 1

## 2023-07-11 MED ORDER — CYCLOBENZAPRINE HCL 5 MG PO TABS: 5.0000 mg | ORAL_TABLET | Freq: Three times a day (TID) | ORAL | 0 refills | Status: AC | PRN

## 2023-07-11 MED ORDER — IBUPROFEN 800 MG PO TABS
800.0000 mg | ORAL_TABLET | Freq: Once | ORAL | Status: AC
Start: 2023-07-11 — End: 2023-07-11
  Administered 2023-07-11: 800 mg via ORAL
  Filled 2023-07-11: qty 1

## 2023-07-11 MED ORDER — IBUPROFEN 800 MG PO TABS: 800.0000 mg | ORAL_TABLET | Freq: Three times a day (TID) | ORAL | 0 refills | Status: AC | PRN

## 2023-07-11 NOTE — ED Triage Notes (Addendum)
Pt here via [facility] after a fall. Pt states she slipped and fell and hit her head and made a hole in the wall. Pt denies LOC, and is not on a blood thinner. Pt c/o pelvis pain and middle lower back pain. Pt has a hx of MS. Pt has c-collar in place.

## 2023-07-11 NOTE — ED Provider Notes (Signed)
[facility] Emergency Department Provider Note     Event Date/Time   First MD Initiated Contact with Patient 07/11/23 1631     (approximate)   History   Fall   HPI  Tabitha Andrews is a 57 y.o. female with a history of MS presents to the ED via EMS following a fall at home.  Patient reports she slipped over carpet and fell down 5-6 steps, hitting her head.  Denies LOC or vomiting.  Patient reports neck pain, right-sided rib pain and pelvic pain localized to the center of her pelvic bone.  Denies anticoagulation use, weakness, chest pain and shortness of breath. No abdominal pain.      Physical Exam   Triage Vital Signs: ED Triage Vitals  Encounter Vitals Group     BP 07/11/23 1418 134/83     Systolic BP Percentile --      Diastolic BP Percentile --      Pulse Rate 07/11/23 1418 74     Resp 07/11/23 1418 18     Temp 07/11/23 1418 98.3 F (36.8 C)     Temp Source 07/11/23 1418 Oral     SpO2 07/11/23 1418 100 %     Weight 07/11/23 1419 126 lb 1.7 oz (57.2 kg)     Height 07/11/23 1419 5\' 4"  (1.626 m)     Head Circumference --      Peak Flow --      Pain Score 07/11/23 1419 4     Pain Loc --      Pain Education --      Exclude from Growth Chart --     Most recent vital signs: Vitals:   07/11/23 1544 07/11/23 1842  BP: 126/67 117/68  Pulse: 75 73  Resp: 18 16  Temp:    SpO2: 99% 99%    General: Alert and oriented. INAD.  Skin:  Warm, dry and intact. No rashes, ecchymosis or lesions noted.     Head:  NCAT.  Eyes:  PERRLA. EOMI.  Nose:   Mucosa is moist. No rhinorrhea. Neck:   No cervical spine tenderness to palpation. Tenderness over right trapezius. Full ROM limited due to C-collar in place.   CV:  Good peripheral perfusion. RRR.  RESP:  Normal effort. LCTAB. No retractions. Airway is patent. ABD:  No distention. Soft, Non tender. No masses or organomegaly.  BACK:  Spinous process is midline without deformity or tenderness. MSK:    Tenderness over right ribs with palpation.  Full ROM in all joints. No swelling, deformity or tenderness.  NEURO: Cranial nerves II-XII intact. No focal deficits. Sensation and motor function intact. 5/5 muscle strength of UE & LE. Gait is at baseline.  Patient uses walker for assistance.    ED Results / Procedures / Treatments   Labs (all labs ordered are listed, but only abnormal results are displayed) Labs Reviewed - No data to display  RADIOLOGY  I personally viewed and evaluated these images as part of my medical decision making, as well as reviewing the written report by the radiologist.  ED Provider Interpretation: CT head and cervical neck appear normal will verify with final radiology read.   No obvious bony abnormality of the pelvis x-ray.  Rib x-ray reveals a abnormality of the ninth rib.  Will confirm with final radiology read.   DG Ribs Unilateral W/Chest Right  Result Date: 07/11/2023 CLINICAL DATA:  Fall down the stairs.  Right rib pain. EXAM: RIGHT RIBS AND  CHEST - 3+ VIEW COMPARISON:  None Available. FINDINGS: Minimally displaced right lateral ninth rib fracture. No additional displaced rib fracture identified. There is no evidence of pneumothorax or pleural effusion. Both lungs are clear. Heart size and mediastinal contours are within normal limits. IMPRESSION: Minimally displaced right lateral ninth rib fracture. No pneumothorax. Electronically Signed   By: Hart Robinsons M.D.   On: 07/11/2023 17:44   DG Pelvis 1-2 Views  Result Date: 07/11/2023 CLINICAL DATA:  Fall.  Pain. EXAM: PELVIS - 1-2 VIEW COMPARISON:  None Available. FINDINGS: There is no evidence of pelvic fracture or diastasis. Evaluation of the sacrum is limited by overlying bowel-gas. The femoral heads are seated within the acetabula. The sacroiliac joints and pubic symphysis are anatomically aligned. IMPRESSION: No acute osseous abnormality on AP pelvic radiograph. Electronically Signed   By: Hart Robinsons M.D.   On: 07/11/2023 17:38   CT Cervical Spine Wo Contrast  Result Date: 07/11/2023 CLINICAL DATA:  Slipped and fell.  Neck trauma. EXAM: CT CERVICAL SPINE WITHOUT CONTRAST TECHNIQUE: Multidetector CT imaging of the cervical spine was performed without intravenous contrast. Multiplanar CT image reconstructions were also generated. RADIATION DOSE REDUCTION: This exam was performed according to the departmental dose-optimization program which includes automated exposure control, adjustment of the mA and/or kV according to patient size and/or use of iterative reconstruction technique. COMPARISON:  07/14/2022 FINDINGS: Alignment: Normal Skull base and vertebrae: Skull base is normal. Unchanged since November of last year, there is discontinuity of the posterior arch of C1 on the left at its junction with the lateral mass. This could be developmental or could be an old ununited fracture. This does not represent an acute injury. The right side of the ring is intact and the situation should be stable. Below that, there is no acute or chronic cervical spine injury. Soft tissues and spinal canal: No traumatic soft tissue finding. Disc levels: No advanced disease. Small uncovertebral osteophytes from C3-4 through C6-7 but without any likely compressive bony canal or foraminal stenosis. Upper chest: Negative Other: None IMPRESSION: 1. No acute or traumatic finding. 2. Unchanged since November of last year, there is discontinuity of the posterior arch of C1 on the left at its junction with the lateral mass. This could be developmental or could be an old ununited fracture. This does not represent an acute injury. The right side of the ring is intact and the situation should be stable. 3. Small uncovertebral osteophytes from C3-4 through C6-7 but without any likely compressive bony canal or foraminal stenosis. Electronically Signed   By: Paulina Fusi M.D.   On: 07/11/2023 17:25   CT HEAD WO CONTRAST  ( )  Result Date: 07/11/2023 CLINICAL DATA:  Larey Seat with trauma to the head. History of multiple sclerosis. EXAM: CT HEAD WITHOUT CONTRAST TECHNIQUE: Contiguous axial images were obtained from the base of the skull through the vertex without intravenous contrast. RADIATION DOSE REDUCTION: This exam was performed according to the departmental dose-optimization program which includes automated exposure control, adjustment of the mA and/or kV according to patient size and/or use of iterative reconstruction technique. COMPARISON:  07/14/2022 CT.  04/04/2018 MRI. FINDINGS: Brain: No sign of acute infarction, mass, hemorrhage, hydrocephalus or extra-axial collection. Foci of chronic low-density are seen in the deep white matter consistent with the clinical history of demyelinating disease. Vascular: Normal Skull: Normal.  No skull fracture. Sinuses/Orbits: Clear/normal Other: None IMPRESSION: No acute or traumatic finding. Foci of chronic low-density in the deep white matter consistent with the  clinical history of demyelinating disease. Electronically Signed   By: Paulina Fusi M.D.   On: 07/11/2023 16:05    PROCEDURES:  Critical Care performed: No  Procedures  MEDICATIONS ORDERED IN ED: Medications  ibuprofen (ADVIL) tablet 800 mg (800 mg Oral Given 07/11/23 1540)  cyclobenzaprine (FLEXERIL) tablet 5 mg (5 mg Oral Given 07/11/23 1540)   IMPRESSION / MDM / ASSESSMENT AND PLAN / ED COURSE  I reviewed the triage vital signs and the nursing notes.                               57 y.o. female presents to the emergency department for evaluation and treatment of neck, rib and pelvic pain following a fall. See HPI for further details.   Differential diagnosis includes, but is not limited to ICH, cervical fracture, flail chest, pneumothorax, fracture  Patient's presentation is most consistent with acute complicated illness / injury requiring diagnostic workup.  Patient is alert and oriented and in no  acute distress.  She is hemodynamically stable.  Physical exam is overall benign with the exception of tenderness over the right rib cage and right trapezius.  I suspect muscular strain versus fracture.  CT head, cervical spine and right rib x-ray obtained in triage.  Plan to obtain additional pelvic imaging.  My suspicions are low of a pelvic fracture given the patient is able to ambulate.  CT head and cervical spine are reassuring.  Pelvic x-ray is reassuring.  Unilateral rib x-ray reveals minimally displaced right lateral ninth rib fracture.  Ibuprofen and Flexeril given in the ED with significant improvement in pain reported by patient.  Given patient is not having shortness of breath and her O2 sat is within normal limits I do believe patient is in stable condition for discharge home close follow-up with orthopedics for further management if needed.  Education of incentive spirometer use provided.  Incentive spirometer device provided. ED precautions discussed. All questions and concerns were addressed during this ED visit.     FINAL CLINICAL IMPRESSION(S) / ED DIAGNOSES   Final diagnoses:  Fall, initial encounter  Closed fracture of one rib of right side, initial encounter   Rx / DC Orders   ED Discharge Orders          Ordered    ibuprofen (ADVIL) 800 MG tablet  Every 8 hours PRN        07/11/23 1836    cyclobenzaprine (FLEXERIL) 5 MG tablet  3 times daily PRN        07/11/23 1836            Note:  This document was prepared using Dragon voice recognition software and may include unintentional dictation errors.    Kern Reap A, PA-C 07/12/23 1116    Minna Antis, MD 07/12/23 2153

## 2023-07-11 NOTE — Discharge Instructions (Signed)
You were evaluated in the ED for a fall.  Your is revealed a rib fracture of your right side on the ninth rib.  This will be treated with pain control.  You have also been given a incentive spirometer.  Please use this twice a day.  Further instructions please read how to use a incentive spirometer attachment.  Follow-up with orthopedics in 1 week.

## 2023-07-11 NOTE — ED Triage Notes (Signed)
Pt here via [facility] from home with a fall from 5-6 stairs, pt did hit her head, no LOC. C-collar in place.   130/84 HR: 80 98%^

## 2023-12-23 ENCOUNTER — Other Ambulatory Visit: Payer: Self-pay | Admitting: Neurology

## 2023-12-23 DIAGNOSIS — G35D Multiple sclerosis, unspecified: Secondary | ICD-10-CM

## 2023-12-23 DIAGNOSIS — G35 Multiple sclerosis: Secondary | ICD-10-CM

## 2024-01-11 ENCOUNTER — Other Ambulatory Visit

## 2024-07-05 ENCOUNTER — Other Ambulatory Visit: Payer: Self-pay | Admitting: Neurology

## 2024-07-05 ENCOUNTER — Encounter: Payer: Self-pay | Admitting: *Deleted

## 2024-07-05 DIAGNOSIS — G35D Multiple sclerosis, unspecified: Secondary | ICD-10-CM

## 2024-07-13 ENCOUNTER — Ambulatory Visit

## 2024-07-13 ENCOUNTER — Ambulatory Visit: Admission: RE | Admit: 2024-07-13
